# Patient Record
Sex: Male | Born: 1940 | Race: White | Hispanic: No | Marital: Single | Smoking: Former smoker
Health system: Southern US, Community
[De-identification: ages and names within clinical notes are randomized; demographics above are authoritative.]

## PROBLEM LIST (undated history)

## (undated) DIAGNOSIS — E039 Hypothyroidism, unspecified: Secondary | ICD-10-CM

## (undated) DIAGNOSIS — C61 Malignant neoplasm of prostate: Secondary | ICD-10-CM

## (undated) DIAGNOSIS — I10 Essential (primary) hypertension: Secondary | ICD-10-CM

## (undated) DIAGNOSIS — M1990 Unspecified osteoarthritis, unspecified site: Secondary | ICD-10-CM

## (undated) HISTORY — DX: Unspecified osteoarthritis, unspecified site: M19.90

## (undated) HISTORY — DX: Malignant neoplasm of prostate: C61

## (undated) HISTORY — DX: Hypothyroidism, unspecified: E03.9

---

## 2002-02-09 ENCOUNTER — Emergency Department (HOSPITAL_COMMUNITY): Admission: EM | Admit: 2002-02-09 | Discharge: 2002-02-09 | Payer: Self-pay | Admitting: Emergency Medicine

## 2005-08-01 ENCOUNTER — Ambulatory Visit: Payer: Self-pay | Admitting: Cardiology

## 2008-09-03 ENCOUNTER — Ambulatory Visit: Payer: Self-pay | Admitting: Cardiology

## 2008-09-09 ENCOUNTER — Ambulatory Visit: Payer: Self-pay | Admitting: Cardiology

## 2008-09-11 ENCOUNTER — Ambulatory Visit: Payer: Self-pay | Admitting: Cardiology

## 2008-10-10 ENCOUNTER — Ambulatory Visit: Payer: Self-pay | Admitting: Cardiology

## 2009-05-29 DIAGNOSIS — I10 Essential (primary) hypertension: Secondary | ICD-10-CM | POA: Insufficient documentation

## 2009-05-29 DIAGNOSIS — I498 Other specified cardiac arrhythmias: Secondary | ICD-10-CM

## 2011-01-25 NOTE — Assessment & Plan Note (Signed)
[facility]                          [facility] OFFICE NOTE   Steven Chapman, Steven Chapman                       MRN:          [id number]  DATE:10/10/2008                            DOB:          02-Sep-1941    PRIMARY CARDIOLOGIST:  Learta Codding, MD,FACC   REASON FOR VISIT:  Post-hospital followup.   Steven Chapman presents to our clinic for the first time, following recent  evaluation by our team here at [facility].  He was referred to [place]  with symptomatic bradycardia.  He presented with no known history of  coronary artery disease, but with several cardiac risk factors.   A 2-D echocardiogram was normal and, therefore, we recommended further  evaluation with outpatient exercise stress Cardiolite testing, as well  as CardioNet monitoring.  He subsequently declined to proceed with the  latter, however.  Stress testing was done for the purpose of both  excluding chronotropic incompetence, as well as any definite evidence of  ischemia.  Left ventricular function was normal both by  echocardiography, as well as stress testing.   The patient has not had any episodes of dizziness or near syncope.  We  felt that his symptoms were most likely related to the medication  tizanidine, which was noted to be associated with severe bradycardia.  He did present with significant hypertension, however, and  recommendation was to continue on lisinopril/HCTZ.   Review of stress testing indicates that his heart rate rose from 75 at  baseline to a maximum of 139.  His pulse today is 90.   Steven Chapman denies any history of exertional angina pectoris.   CURRENT MEDICATIONS:  Lisinopril and hydrochlorothiazide 20/12.5 mg 2  tablets q.a.m.   PHYSICAL EXAMINATION:  VITAL SIGNS:  Blood pressure 172/93, pulse 90 and  regular, and weight 193.  GENERAL:  A 70 year old male, sitting upright, no distress.  HEENT:  Normocephalic and atraumatic.  NECK:  Palpable bilateral  carotid pulses; no JVD.  LUNGS:  Clear to auscultation in all fields.  HEART:  Regular rate and rhythm.  No significant murmurs.  No rubs.  ABDOMEN:  Benign.  EXTREMITIES:  No edema.  NEUROLOGIC:  No focal deficit.   IMPRESSION:  1. Status post symptomatic sinus bradycardia.      a.     Most likely related to tizanidine.      b.     Normal left ventricular function.      c.     Negative, adequate exercise stress Cardiolite; EF 61%,       December 2009.      d.     No evidence of chronotropic incompetence.  2. Hypertension, uncontrolled.  3. COPD/ongoing tobacco.  4. History of alcohol abuse.   PLAN:  1. No further cardiac testing is indicated at this point in time.  The      patient has no evidence of recurrent bradycardia both by history or      by current vital signs.  Although we had initially recommended      further evaluation by CardioNet monitoring, which the patient  refused, his bradycardia was most likely related to the adverse      effects of tizanidine.  We do, however, recommended that the      patient start taking low-dose aspirin on a regular basis, for      primary prevention.  We also recommend that his lipid status be      reassessed.  Prior data indicated an LDL of 79 in 2006.  2. The patient needs more aggressive blood pressure control.  We spoke      at length regarding the importance of this.  He has agreed to have      an additional medication started by [place], Norvasc 10 mg daily, but      with subsequent monitoring and further recommendations to be      deferred to Dr. Olena Leatherwood, with whom he follows on a regular basis.      The patient also has been advised to monitor his blood pressure in      the ambulatory setting, so that he is more aware of what his blood      pressure readings are.  3. The patient will return to Dr. Andee Lineman on an as-needed basis.      Steven Serpe, PA-C  Electronically Signed      Learta Codding, MD,FACC  Electronically  Signed   GS/MedQ  DD: 10/10/2008  DT: 10/11/2008  Job #: [id number]   cc:   Lia Hopping

## 2011-09-30 DIAGNOSIS — F329 Major depressive disorder, single episode, unspecified: Secondary | ICD-10-CM | POA: Diagnosis not present

## 2011-09-30 DIAGNOSIS — I10 Essential (primary) hypertension: Secondary | ICD-10-CM | POA: Diagnosis not present

## 2011-12-30 DIAGNOSIS — I10 Essential (primary) hypertension: Secondary | ICD-10-CM | POA: Diagnosis not present

## 2012-04-05 DIAGNOSIS — M549 Dorsalgia, unspecified: Secondary | ICD-10-CM | POA: Diagnosis not present

## 2012-07-09 DIAGNOSIS — I10 Essential (primary) hypertension: Secondary | ICD-10-CM | POA: Diagnosis not present

## 2012-10-11 DIAGNOSIS — Z13220 Encounter for screening for lipoid disorders: Secondary | ICD-10-CM | POA: Diagnosis not present

## 2012-10-11 DIAGNOSIS — E782 Mixed hyperlipidemia: Secondary | ICD-10-CM | POA: Diagnosis not present

## 2012-10-11 DIAGNOSIS — Z125 Encounter for screening for malignant neoplasm of prostate: Secondary | ICD-10-CM | POA: Diagnosis not present

## 2012-10-11 DIAGNOSIS — I10 Essential (primary) hypertension: Secondary | ICD-10-CM | POA: Diagnosis not present

## 2012-10-11 DIAGNOSIS — M7040 Prepatellar bursitis, unspecified knee: Secondary | ICD-10-CM | POA: Diagnosis not present

## 2012-10-23 DIAGNOSIS — R39198 Other difficulties with micturition: Secondary | ICD-10-CM | POA: Diagnosis not present

## 2012-10-23 DIAGNOSIS — R972 Elevated prostate specific antigen [PSA]: Secondary | ICD-10-CM | POA: Diagnosis not present

## 2012-11-09 DIAGNOSIS — K5641 Fecal impaction: Secondary | ICD-10-CM | POA: Diagnosis not present

## 2012-11-09 DIAGNOSIS — Z79899 Other long term (current) drug therapy: Secondary | ICD-10-CM | POA: Diagnosis not present

## 2012-11-09 DIAGNOSIS — F17200 Nicotine dependence, unspecified, uncomplicated: Secondary | ICD-10-CM | POA: Diagnosis not present

## 2012-11-09 DIAGNOSIS — I10 Essential (primary) hypertension: Secondary | ICD-10-CM | POA: Diagnosis not present

## 2012-11-09 DIAGNOSIS — Z85038 Personal history of other malignant neoplasm of large intestine: Secondary | ICD-10-CM | POA: Diagnosis not present

## 2012-11-09 DIAGNOSIS — Z7982 Long term (current) use of aspirin: Secondary | ICD-10-CM | POA: Diagnosis not present

## 2012-11-21 DIAGNOSIS — R972 Elevated prostate specific antigen [PSA]: Secondary | ICD-10-CM | POA: Diagnosis not present

## 2013-01-10 DIAGNOSIS — I10 Essential (primary) hypertension: Secondary | ICD-10-CM | POA: Diagnosis not present

## 2013-01-17 DIAGNOSIS — H9390 Unspecified disorder of ear, unspecified ear: Secondary | ICD-10-CM | POA: Diagnosis not present

## 2013-01-17 DIAGNOSIS — Z79899 Other long term (current) drug therapy: Secondary | ICD-10-CM | POA: Diagnosis not present

## 2013-01-17 DIAGNOSIS — F17200 Nicotine dependence, unspecified, uncomplicated: Secondary | ICD-10-CM | POA: Diagnosis not present

## 2013-01-17 DIAGNOSIS — I10 Essential (primary) hypertension: Secondary | ICD-10-CM | POA: Diagnosis not present

## 2013-01-17 DIAGNOSIS — Z85038 Personal history of other malignant neoplasm of large intestine: Secondary | ICD-10-CM | POA: Diagnosis not present

## 2013-01-17 DIAGNOSIS — H9210 Otorrhea, unspecified ear: Secondary | ICD-10-CM | POA: Diagnosis not present

## 2013-02-08 DIAGNOSIS — R972 Elevated prostate specific antigen [PSA]: Secondary | ICD-10-CM | POA: Diagnosis not present

## 2013-02-20 DIAGNOSIS — R972 Elevated prostate specific antigen [PSA]: Secondary | ICD-10-CM | POA: Diagnosis not present

## 2013-04-12 DIAGNOSIS — I10 Essential (primary) hypertension: Secondary | ICD-10-CM | POA: Diagnosis not present

## 2013-07-15 DIAGNOSIS — I10 Essential (primary) hypertension: Secondary | ICD-10-CM | POA: Diagnosis not present

## 2013-08-22 DIAGNOSIS — R972 Elevated prostate specific antigen [PSA]: Secondary | ICD-10-CM | POA: Diagnosis not present

## 2013-08-28 DIAGNOSIS — R972 Elevated prostate specific antigen [PSA]: Secondary | ICD-10-CM | POA: Diagnosis not present

## 2013-08-28 DIAGNOSIS — R39198 Other difficulties with micturition: Secondary | ICD-10-CM | POA: Diagnosis not present

## 2013-10-17 DIAGNOSIS — Z0000 Encounter for general adult medical examination without abnormal findings: Secondary | ICD-10-CM | POA: Diagnosis not present

## 2013-10-17 DIAGNOSIS — I10 Essential (primary) hypertension: Secondary | ICD-10-CM | POA: Diagnosis not present

## 2013-12-07 DIAGNOSIS — Z79899 Other long term (current) drug therapy: Secondary | ICD-10-CM | POA: Diagnosis not present

## 2013-12-07 DIAGNOSIS — I10 Essential (primary) hypertension: Secondary | ICD-10-CM | POA: Diagnosis not present

## 2013-12-07 DIAGNOSIS — S0010XA Contusion of unspecified eyelid and periocular area, initial encounter: Secondary | ICD-10-CM | POA: Diagnosis not present

## 2013-12-07 DIAGNOSIS — H1130 Conjunctival hemorrhage, unspecified eye: Secondary | ICD-10-CM | POA: Diagnosis not present

## 2013-12-07 DIAGNOSIS — S0510XA Contusion of eyeball and orbital tissues, unspecified eye, initial encounter: Secondary | ICD-10-CM | POA: Diagnosis not present

## 2013-12-07 DIAGNOSIS — F17200 Nicotine dependence, unspecified, uncomplicated: Secondary | ICD-10-CM | POA: Diagnosis not present

## 2013-12-07 DIAGNOSIS — IMO0002 Reserved for concepts with insufficient information to code with codable children: Secondary | ICD-10-CM | POA: Diagnosis not present

## 2013-12-17 DIAGNOSIS — H1130 Conjunctival hemorrhage, unspecified eye: Secondary | ICD-10-CM | POA: Diagnosis not present

## 2014-01-20 DIAGNOSIS — Z125 Encounter for screening for malignant neoplasm of prostate: Secondary | ICD-10-CM | POA: Diagnosis not present

## 2014-01-20 DIAGNOSIS — Z0000 Encounter for general adult medical examination without abnormal findings: Secondary | ICD-10-CM | POA: Diagnosis not present

## 2014-01-20 DIAGNOSIS — I10 Essential (primary) hypertension: Secondary | ICD-10-CM | POA: Diagnosis not present

## 2014-01-20 DIAGNOSIS — E119 Type 2 diabetes mellitus without complications: Secondary | ICD-10-CM | POA: Diagnosis not present

## 2014-02-21 DIAGNOSIS — R972 Elevated prostate specific antigen [PSA]: Secondary | ICD-10-CM | POA: Diagnosis not present

## 2014-02-26 DIAGNOSIS — R972 Elevated prostate specific antigen [PSA]: Secondary | ICD-10-CM | POA: Diagnosis not present

## 2014-04-24 DIAGNOSIS — I10 Essential (primary) hypertension: Secondary | ICD-10-CM | POA: Diagnosis not present

## 2014-04-24 DIAGNOSIS — E119 Type 2 diabetes mellitus without complications: Secondary | ICD-10-CM | POA: Diagnosis not present

## 2014-07-07 DIAGNOSIS — Z23 Encounter for immunization: Secondary | ICD-10-CM | POA: Diagnosis not present

## 2014-07-25 DIAGNOSIS — I10 Essential (primary) hypertension: Secondary | ICD-10-CM | POA: Diagnosis not present

## 2014-07-25 DIAGNOSIS — Z131 Encounter for screening for diabetes mellitus: Secondary | ICD-10-CM | POA: Diagnosis not present

## 2014-07-25 DIAGNOSIS — M1990 Unspecified osteoarthritis, unspecified site: Secondary | ICD-10-CM | POA: Diagnosis not present

## 2014-07-25 DIAGNOSIS — E119 Type 2 diabetes mellitus without complications: Secondary | ICD-10-CM | POA: Diagnosis not present

## 2014-08-19 DIAGNOSIS — R972 Elevated prostate specific antigen [PSA]: Secondary | ICD-10-CM | POA: Diagnosis not present

## 2014-08-26 DIAGNOSIS — R972 Elevated prostate specific antigen [PSA]: Secondary | ICD-10-CM | POA: Diagnosis not present

## 2014-10-09 DIAGNOSIS — R972 Elevated prostate specific antigen [PSA]: Secondary | ICD-10-CM | POA: Diagnosis not present

## 2014-10-09 DIAGNOSIS — D291 Benign neoplasm of prostate: Secondary | ICD-10-CM | POA: Diagnosis not present

## 2014-10-09 DIAGNOSIS — C61 Malignant neoplasm of prostate: Secondary | ICD-10-CM | POA: Diagnosis not present

## 2014-10-24 DIAGNOSIS — R972 Elevated prostate specific antigen [PSA]: Secondary | ICD-10-CM | POA: Diagnosis not present

## 2014-10-24 DIAGNOSIS — C61 Malignant neoplasm of prostate: Secondary | ICD-10-CM | POA: Diagnosis not present

## 2014-10-30 DIAGNOSIS — I10 Essential (primary) hypertension: Secondary | ICD-10-CM | POA: Diagnosis not present

## 2014-10-30 DIAGNOSIS — D075 Carcinoma in situ of prostate: Secondary | ICD-10-CM | POA: Diagnosis not present

## 2014-11-14 DIAGNOSIS — C61 Malignant neoplasm of prostate: Secondary | ICD-10-CM | POA: Diagnosis not present

## 2014-12-23 DIAGNOSIS — N401 Enlarged prostate with lower urinary tract symptoms: Secondary | ICD-10-CM | POA: Diagnosis not present

## 2014-12-23 DIAGNOSIS — C61 Malignant neoplasm of prostate: Secondary | ICD-10-CM | POA: Diagnosis not present

## 2014-12-23 DIAGNOSIS — R3916 Straining to void: Secondary | ICD-10-CM | POA: Diagnosis not present

## 2015-01-29 DIAGNOSIS — Z1389 Encounter for screening for other disorder: Secondary | ICD-10-CM | POA: Diagnosis not present

## 2015-01-29 DIAGNOSIS — I10 Essential (primary) hypertension: Secondary | ICD-10-CM | POA: Diagnosis not present

## 2015-01-29 DIAGNOSIS — Z131 Encounter for screening for diabetes mellitus: Secondary | ICD-10-CM | POA: Diagnosis not present

## 2015-01-29 DIAGNOSIS — Z0000 Encounter for general adult medical examination without abnormal findings: Secondary | ICD-10-CM | POA: Diagnosis not present

## 2015-01-30 DIAGNOSIS — Z23 Encounter for immunization: Secondary | ICD-10-CM | POA: Diagnosis not present

## 2015-02-19 DIAGNOSIS — M85851 Other specified disorders of bone density and structure, right thigh: Secondary | ICD-10-CM | POA: Diagnosis not present

## 2015-02-19 DIAGNOSIS — M8588 Other specified disorders of bone density and structure, other site: Secondary | ICD-10-CM | POA: Diagnosis not present

## 2015-02-19 DIAGNOSIS — I10 Essential (primary) hypertension: Secondary | ICD-10-CM | POA: Diagnosis not present

## 2015-02-19 DIAGNOSIS — M85852 Other specified disorders of bone density and structure, left thigh: Secondary | ICD-10-CM | POA: Diagnosis not present

## 2015-02-19 DIAGNOSIS — F17200 Nicotine dependence, unspecified, uncomplicated: Secondary | ICD-10-CM | POA: Diagnosis not present

## 2015-02-19 DIAGNOSIS — M1990 Unspecified osteoarthritis, unspecified site: Secondary | ICD-10-CM | POA: Diagnosis not present

## 2015-02-19 DIAGNOSIS — Z79899 Other long term (current) drug therapy: Secondary | ICD-10-CM | POA: Diagnosis not present

## 2015-02-19 DIAGNOSIS — C189 Malignant neoplasm of colon, unspecified: Secondary | ICD-10-CM | POA: Diagnosis not present

## 2015-02-19 DIAGNOSIS — M8589 Other specified disorders of bone density and structure, multiple sites: Secondary | ICD-10-CM | POA: Diagnosis not present

## 2015-03-19 DIAGNOSIS — C61 Malignant neoplasm of prostate: Secondary | ICD-10-CM | POA: Diagnosis not present

## 2015-05-04 DIAGNOSIS — I10 Essential (primary) hypertension: Secondary | ICD-10-CM | POA: Diagnosis not present

## 2015-06-02 DIAGNOSIS — H2513 Age-related nuclear cataract, bilateral: Secondary | ICD-10-CM | POA: Diagnosis not present

## 2015-06-02 DIAGNOSIS — H538 Other visual disturbances: Secondary | ICD-10-CM | POA: Diagnosis not present

## 2015-06-16 DIAGNOSIS — Z23 Encounter for immunization: Secondary | ICD-10-CM | POA: Diagnosis not present

## 2015-07-02 DIAGNOSIS — H538 Other visual disturbances: Secondary | ICD-10-CM | POA: Diagnosis not present

## 2015-07-02 DIAGNOSIS — H2511 Age-related nuclear cataract, right eye: Secondary | ICD-10-CM | POA: Diagnosis not present

## 2015-07-06 DIAGNOSIS — H538 Other visual disturbances: Secondary | ICD-10-CM | POA: Diagnosis not present

## 2015-07-06 DIAGNOSIS — Z79899 Other long term (current) drug therapy: Secondary | ICD-10-CM | POA: Diagnosis not present

## 2015-07-06 DIAGNOSIS — H2511 Age-related nuclear cataract, right eye: Secondary | ICD-10-CM | POA: Diagnosis not present

## 2015-07-06 DIAGNOSIS — H1132 Conjunctival hemorrhage, left eye: Secondary | ICD-10-CM | POA: Diagnosis not present

## 2015-07-06 DIAGNOSIS — H5210 Myopia, unspecified eye: Secondary | ICD-10-CM | POA: Diagnosis not present

## 2015-07-06 DIAGNOSIS — H269 Unspecified cataract: Secondary | ICD-10-CM | POA: Diagnosis not present

## 2015-07-06 DIAGNOSIS — H01003 Unspecified blepharitis right eye, unspecified eyelid: Secondary | ICD-10-CM | POA: Diagnosis not present

## 2015-07-06 DIAGNOSIS — H2513 Age-related nuclear cataract, bilateral: Secondary | ICD-10-CM | POA: Diagnosis not present

## 2015-07-06 DIAGNOSIS — H01006 Unspecified blepharitis left eye, unspecified eyelid: Secondary | ICD-10-CM | POA: Diagnosis not present

## 2015-07-06 DIAGNOSIS — H524 Presbyopia: Secondary | ICD-10-CM | POA: Diagnosis not present

## 2015-07-06 DIAGNOSIS — I10 Essential (primary) hypertension: Secondary | ICD-10-CM | POA: Diagnosis not present

## 2015-07-06 DIAGNOSIS — H52209 Unspecified astigmatism, unspecified eye: Secondary | ICD-10-CM | POA: Diagnosis not present

## 2015-07-06 DIAGNOSIS — Z85038 Personal history of other malignant neoplasm of large intestine: Secondary | ICD-10-CM | POA: Diagnosis not present

## 2015-07-06 DIAGNOSIS — M1990 Unspecified osteoarthritis, unspecified site: Secondary | ICD-10-CM | POA: Diagnosis not present

## 2015-07-06 DIAGNOSIS — H5200 Hypermetropia, unspecified eye: Secondary | ICD-10-CM | POA: Diagnosis not present

## 2015-08-03 DIAGNOSIS — I10 Essential (primary) hypertension: Secondary | ICD-10-CM | POA: Diagnosis not present

## 2015-08-03 DIAGNOSIS — M170 Bilateral primary osteoarthritis of knee: Secondary | ICD-10-CM | POA: Diagnosis not present

## 2015-08-03 DIAGNOSIS — Z131 Encounter for screening for diabetes mellitus: Secondary | ICD-10-CM | POA: Diagnosis not present

## 2015-09-08 DIAGNOSIS — C61 Malignant neoplasm of prostate: Secondary | ICD-10-CM | POA: Diagnosis not present

## 2015-09-15 DIAGNOSIS — R972 Elevated prostate specific antigen [PSA]: Secondary | ICD-10-CM | POA: Diagnosis not present

## 2015-09-15 DIAGNOSIS — C61 Malignant neoplasm of prostate: Secondary | ICD-10-CM | POA: Diagnosis not present

## 2015-11-03 DIAGNOSIS — M170 Bilateral primary osteoarthritis of knee: Secondary | ICD-10-CM | POA: Diagnosis not present

## 2015-11-03 DIAGNOSIS — I10 Essential (primary) hypertension: Secondary | ICD-10-CM | POA: Diagnosis not present

## 2016-01-12 DIAGNOSIS — H538 Other visual disturbances: Secondary | ICD-10-CM | POA: Diagnosis not present

## 2016-01-12 DIAGNOSIS — H25012 Cortical age-related cataract, left eye: Secondary | ICD-10-CM | POA: Diagnosis not present

## 2016-02-02 DIAGNOSIS — I10 Essential (primary) hypertension: Secondary | ICD-10-CM | POA: Diagnosis not present

## 2016-02-02 DIAGNOSIS — Z131 Encounter for screening for diabetes mellitus: Secondary | ICD-10-CM | POA: Diagnosis not present

## 2016-02-02 DIAGNOSIS — M174 Other bilateral secondary osteoarthritis of knee: Secondary | ICD-10-CM | POA: Diagnosis not present

## 2016-02-02 DIAGNOSIS — Z1389 Encounter for screening for other disorder: Secondary | ICD-10-CM | POA: Diagnosis not present

## 2016-02-02 DIAGNOSIS — Z0000 Encounter for general adult medical examination without abnormal findings: Secondary | ICD-10-CM | POA: Diagnosis not present

## 2016-02-02 DIAGNOSIS — Z23 Encounter for immunization: Secondary | ICD-10-CM | POA: Diagnosis not present

## 2016-02-15 DIAGNOSIS — Z87891 Personal history of nicotine dependence: Secondary | ICD-10-CM | POA: Diagnosis not present

## 2016-02-15 DIAGNOSIS — Z136 Encounter for screening for cardiovascular disorders: Secondary | ICD-10-CM | POA: Diagnosis not present

## 2016-02-15 DIAGNOSIS — R1084 Generalized abdominal pain: Secondary | ICD-10-CM | POA: Diagnosis not present

## 2016-03-14 DIAGNOSIS — C61 Malignant neoplasm of prostate: Secondary | ICD-10-CM | POA: Diagnosis not present

## 2016-03-14 DIAGNOSIS — R972 Elevated prostate specific antigen [PSA]: Secondary | ICD-10-CM | POA: Diagnosis not present

## 2016-03-22 DIAGNOSIS — C61 Malignant neoplasm of prostate: Secondary | ICD-10-CM | POA: Diagnosis not present

## 2016-03-22 DIAGNOSIS — R972 Elevated prostate specific antigen [PSA]: Secondary | ICD-10-CM | POA: Diagnosis not present

## 2016-04-07 DIAGNOSIS — Z9049 Acquired absence of other specified parts of digestive tract: Secondary | ICD-10-CM | POA: Diagnosis not present

## 2016-04-07 DIAGNOSIS — Z85038 Personal history of other malignant neoplasm of large intestine: Secondary | ICD-10-CM | POA: Diagnosis not present

## 2016-04-07 DIAGNOSIS — Z79899 Other long term (current) drug therapy: Secondary | ICD-10-CM | POA: Diagnosis not present

## 2016-04-07 DIAGNOSIS — I10 Essential (primary) hypertension: Secondary | ICD-10-CM | POA: Diagnosis not present

## 2016-04-07 DIAGNOSIS — Z1211 Encounter for screening for malignant neoplasm of colon: Secondary | ICD-10-CM | POA: Diagnosis not present

## 2016-05-10 DIAGNOSIS — I10 Essential (primary) hypertension: Secondary | ICD-10-CM | POA: Diagnosis not present

## 2016-05-10 DIAGNOSIS — M174 Other bilateral secondary osteoarthritis of knee: Secondary | ICD-10-CM | POA: Diagnosis not present

## 2016-07-05 DIAGNOSIS — Z23 Encounter for immunization: Secondary | ICD-10-CM | POA: Diagnosis not present

## 2016-08-12 DIAGNOSIS — M174 Other bilateral secondary osteoarthritis of knee: Secondary | ICD-10-CM | POA: Diagnosis not present

## 2016-08-12 DIAGNOSIS — I10 Essential (primary) hypertension: Secondary | ICD-10-CM | POA: Diagnosis not present

## 2016-08-12 DIAGNOSIS — Z131 Encounter for screening for diabetes mellitus: Secondary | ICD-10-CM | POA: Diagnosis not present

## 2016-09-15 DIAGNOSIS — C61 Malignant neoplasm of prostate: Secondary | ICD-10-CM | POA: Diagnosis not present

## 2016-09-22 DIAGNOSIS — C61 Malignant neoplasm of prostate: Secondary | ICD-10-CM | POA: Diagnosis not present

## 2016-09-22 DIAGNOSIS — R972 Elevated prostate specific antigen [PSA]: Secondary | ICD-10-CM | POA: Diagnosis not present

## 2016-09-23 DIAGNOSIS — H26491 Other secondary cataract, right eye: Secondary | ICD-10-CM | POA: Diagnosis not present

## 2016-09-23 DIAGNOSIS — H2512 Age-related nuclear cataract, left eye: Secondary | ICD-10-CM | POA: Diagnosis not present

## 2016-09-23 DIAGNOSIS — H11153 Pinguecula, bilateral: Secondary | ICD-10-CM | POA: Diagnosis not present

## 2016-09-23 DIAGNOSIS — H35411 Lattice degeneration of retina, right eye: Secondary | ICD-10-CM | POA: Diagnosis not present

## 2016-09-23 DIAGNOSIS — H11823 Conjunctivochalasis, bilateral: Secondary | ICD-10-CM | POA: Diagnosis not present

## 2016-09-23 DIAGNOSIS — Z961 Presence of intraocular lens: Secondary | ICD-10-CM | POA: Diagnosis not present

## 2016-09-23 DIAGNOSIS — H25012 Cortical age-related cataract, left eye: Secondary | ICD-10-CM | POA: Diagnosis not present

## 2016-09-23 DIAGNOSIS — H25812 Combined forms of age-related cataract, left eye: Secondary | ICD-10-CM | POA: Diagnosis not present

## 2016-09-23 DIAGNOSIS — I10 Essential (primary) hypertension: Secondary | ICD-10-CM | POA: Diagnosis not present

## 2016-09-23 DIAGNOSIS — D3131 Benign neoplasm of right choroid: Secondary | ICD-10-CM | POA: Diagnosis not present

## 2016-09-23 DIAGNOSIS — H3589 Other specified retinal disorders: Secondary | ICD-10-CM | POA: Diagnosis not present

## 2016-09-23 DIAGNOSIS — H35033 Hypertensive retinopathy, bilateral: Secondary | ICD-10-CM | POA: Diagnosis not present

## 2016-11-14 DIAGNOSIS — Z125 Encounter for screening for malignant neoplasm of prostate: Secondary | ICD-10-CM | POA: Diagnosis not present

## 2016-11-14 DIAGNOSIS — Z131 Encounter for screening for diabetes mellitus: Secondary | ICD-10-CM | POA: Diagnosis not present

## 2016-11-14 DIAGNOSIS — M174 Other bilateral secondary osteoarthritis of knee: Secondary | ICD-10-CM | POA: Diagnosis not present

## 2016-11-14 DIAGNOSIS — I10 Essential (primary) hypertension: Secondary | ICD-10-CM | POA: Diagnosis not present

## 2016-11-23 ENCOUNTER — Ambulatory Visit (INDEPENDENT_AMBULATORY_CARE_PROVIDER_SITE_OTHER): Payer: Medicare Other | Admitting: Urology

## 2016-11-23 ENCOUNTER — Other Ambulatory Visit (HOSPITAL_COMMUNITY)
Admission: AD | Admit: 2016-11-23 | Discharge: 2016-11-23 | Disposition: A | Discharge status: Home / Self Care | Payer: Medicare Other | Source: Other Acute Inpatient Hospital | Attending: Urology | Admitting: Urology

## 2016-11-23 DIAGNOSIS — R3129 Other microscopic hematuria: Secondary | ICD-10-CM | POA: Insufficient documentation

## 2016-11-23 DIAGNOSIS — C61 Malignant neoplasm of prostate: Secondary | ICD-10-CM | POA: Diagnosis not present

## 2016-11-23 LAB — URINALYSIS, COMPLETE (UACMP) WITH MICROSCOPIC
BACTERIA UA: NONE SEEN
Bilirubin Urine: NEGATIVE
GLUCOSE, UA: NEGATIVE mg/dL
Hgb urine dipstick: NEGATIVE
Ketones, ur: 5 mg/dL — AB
Leukocytes, UA: NEGATIVE
Nitrite: NEGATIVE
PH: 7 (ref 5.0–8.0)
Protein, ur: NEGATIVE mg/dL
Specific Gravity, Urine: 1.008 (ref 1.005–1.030)
WBC, UA: NONE SEEN WBC/hpf (ref 0–5)

## 2016-11-24 ENCOUNTER — Other Ambulatory Visit: Payer: Self-pay | Admitting: Urology

## 2016-11-24 DIAGNOSIS — C61 Malignant neoplasm of prostate: Secondary | ICD-10-CM

## 2016-12-07 ENCOUNTER — Encounter (HOSPITAL_COMMUNITY): Payer: Self-pay

## 2016-12-07 ENCOUNTER — Ambulatory Visit (HOSPITAL_COMMUNITY)
Admission: RE | Admit: 2016-12-07 | Discharge: 2016-12-07 | Disposition: A | Discharge status: Home / Self Care | Payer: Medicare Other | Source: Ambulatory Visit | Attending: Urology | Admitting: Urology

## 2016-12-07 DIAGNOSIS — C61 Malignant neoplasm of prostate: Secondary | ICD-10-CM | POA: Diagnosis not present

## 2016-12-07 HISTORY — DX: Essential (primary) hypertension: I10

## 2016-12-07 MED ORDER — LIDOCAINE HCL (PF) 2 % IJ SOLN
INTRAMUSCULAR | Status: AC
  Administered 2016-12-07: 10 mL
  Filled 2016-12-07: qty 10

## 2016-12-07 MED ORDER — GENTAMICIN SULFATE 40 MG/ML IJ SOLN: 160.0000 mg | Freq: Once | INTRAMUSCULAR | Status: DC

## 2016-12-07 MED ORDER — GENTAMICIN SULFATE 40 MG/ML IJ SOLN
INTRAMUSCULAR | Status: AC
  Filled 2016-12-07: qty 2

## 2016-12-07 MED ORDER — GENTAMICIN SULFATE 40 MG/ML IJ SOLN
80.0000 mg | Freq: Once | INTRAMUSCULAR | Status: AC
Start: 2016-12-07 — End: 2016-12-07
  Administered 2016-12-07: 80 mg via INTRAMUSCULAR

## 2016-12-07 NOTE — Discharge Instructions (Signed)
Transrectal Ultrasound-Guided Biopsy °A transrectal ultrasound-guided biopsy is a procedure to take samples of tissue from your prostate. Ultrasound images are used to guide the procedure. It is usually done to check the prostate gland for cancer. °What happens before the procedure? °· Do not eat or drink after midnight on the night before your procedure. °· Take medicines as your doctor tells you. °· Your doctor may have you stop taking some medicines 5-7 days before the procedure. °· You will be given an enema before your procedure. During an enema, a liquid is put into your butt (rectum) to clear out waste. °· You may have lab tests the day of your procedure. °· Make plans to have someone drive you home. °What happens during the procedure? °· You will be given medicine to help you relax before the procedure. An IV tube will be put into one of your veins. It will be used to give fluids and medicine. °· You will be given medicine to reduce the risk of infection (antibiotic). °· You will be placed on your side. °· A probe with gel will be put in your butt. This is used to take pictures of your prostate and the area around it. °· A medicine to numb the area is put into your prostate. °· A biopsy needle is then inserted and guided to your prostate. °· Samples of prostate tissue are taken. The needle is removed. °· The samples are sent to a lab to be checked. Results are usually back in 2-3 days. °What happens after the procedure? °· You will be taken to a room where you will be watched until you are doing okay. °· You may have some pain in the area around your butt. You will be given medicines for this. °· You may be able to go home the same day. Sometimes, an overnight stay in the hospital is needed. °This information is not intended to replace advice given to you by your health care provider. Make sure you discuss any questions you have with your health care provider. °Document Released: 08/17/2009 Document Revised:  02/04/2016 Document Reviewed: 04/17/2013 °Elsevier Interactive Patient Education © 2017 Elsevier Inc. ° °

## 2016-12-21 ENCOUNTER — Ambulatory Visit (INDEPENDENT_AMBULATORY_CARE_PROVIDER_SITE_OTHER): Payer: Medicare Other | Admitting: Urology

## 2016-12-21 DIAGNOSIS — C61 Malignant neoplasm of prostate: Secondary | ICD-10-CM

## 2017-02-17 DIAGNOSIS — I10 Essential (primary) hypertension: Secondary | ICD-10-CM | POA: Diagnosis not present

## 2017-02-17 DIAGNOSIS — M174 Other bilateral secondary osteoarthritis of knee: Secondary | ICD-10-CM | POA: Diagnosis not present

## 2017-02-17 DIAGNOSIS — R601 Generalized edema: Secondary | ICD-10-CM | POA: Diagnosis not present

## 2017-06-06 DIAGNOSIS — Z131 Encounter for screening for diabetes mellitus: Secondary | ICD-10-CM | POA: Diagnosis not present

## 2017-06-06 DIAGNOSIS — Z0000 Encounter for general adult medical examination without abnormal findings: Secondary | ICD-10-CM | POA: Diagnosis not present

## 2017-06-06 DIAGNOSIS — Z1389 Encounter for screening for other disorder: Secondary | ICD-10-CM | POA: Diagnosis not present

## 2017-06-06 DIAGNOSIS — R601 Generalized edema: Secondary | ICD-10-CM | POA: Diagnosis not present

## 2017-06-06 DIAGNOSIS — M174 Other bilateral secondary osteoarthritis of knee: Secondary | ICD-10-CM | POA: Diagnosis not present

## 2017-06-06 DIAGNOSIS — I10 Essential (primary) hypertension: Secondary | ICD-10-CM | POA: Diagnosis not present

## 2017-06-06 DIAGNOSIS — Z79899 Other long term (current) drug therapy: Secondary | ICD-10-CM | POA: Diagnosis not present

## 2017-06-08 DIAGNOSIS — H11153 Pinguecula, bilateral: Secondary | ICD-10-CM | POA: Diagnosis not present

## 2017-06-08 DIAGNOSIS — Z23 Encounter for immunization: Secondary | ICD-10-CM | POA: Diagnosis not present

## 2017-06-08 DIAGNOSIS — H3540 Unspecified peripheral retinal degeneration: Secondary | ICD-10-CM | POA: Diagnosis not present

## 2017-06-08 DIAGNOSIS — D3131 Benign neoplasm of right choroid: Secondary | ICD-10-CM | POA: Diagnosis not present

## 2017-06-08 DIAGNOSIS — H26491 Other secondary cataract, right eye: Secondary | ICD-10-CM | POA: Diagnosis not present

## 2017-06-08 DIAGNOSIS — H25012 Cortical age-related cataract, left eye: Secondary | ICD-10-CM | POA: Diagnosis not present

## 2017-06-08 DIAGNOSIS — H25812 Combined forms of age-related cataract, left eye: Secondary | ICD-10-CM | POA: Diagnosis not present

## 2017-06-08 DIAGNOSIS — H2512 Age-related nuclear cataract, left eye: Secondary | ICD-10-CM | POA: Diagnosis not present

## 2017-06-08 DIAGNOSIS — H35443 Age-related reticular degeneration of retina, bilateral: Secondary | ICD-10-CM | POA: Diagnosis not present

## 2017-06-08 DIAGNOSIS — Z961 Presence of intraocular lens: Secondary | ICD-10-CM | POA: Diagnosis not present

## 2017-06-08 DIAGNOSIS — H11823 Conjunctivochalasis, bilateral: Secondary | ICD-10-CM | POA: Diagnosis not present

## 2017-06-08 DIAGNOSIS — H35463 Secondary vitreoretinal degeneration, bilateral: Secondary | ICD-10-CM | POA: Diagnosis not present

## 2017-06-08 DIAGNOSIS — D3132 Benign neoplasm of left choroid: Secondary | ICD-10-CM | POA: Diagnosis not present

## 2017-06-26 DIAGNOSIS — Z125 Encounter for screening for malignant neoplasm of prostate: Secondary | ICD-10-CM | POA: Diagnosis not present

## 2017-06-26 DIAGNOSIS — Z79899 Other long term (current) drug therapy: Secondary | ICD-10-CM | POA: Diagnosis not present

## 2017-06-26 DIAGNOSIS — Z0000 Encounter for general adult medical examination without abnormal findings: Secondary | ICD-10-CM | POA: Diagnosis not present

## 2017-07-05 ENCOUNTER — Ambulatory Visit: Payer: Medicare Other | Admitting: Urology

## 2017-08-30 ENCOUNTER — Ambulatory Visit (INDEPENDENT_AMBULATORY_CARE_PROVIDER_SITE_OTHER): Payer: Medicare Other | Admitting: Urology

## 2017-08-30 DIAGNOSIS — C61 Malignant neoplasm of prostate: Secondary | ICD-10-CM | POA: Diagnosis not present

## 2017-09-07 DIAGNOSIS — I10 Essential (primary) hypertension: Secondary | ICD-10-CM | POA: Diagnosis not present

## 2017-09-07 DIAGNOSIS — M174 Other bilateral secondary osteoarthritis of knee: Secondary | ICD-10-CM | POA: Diagnosis not present

## 2017-09-12 HISTORY — PX: STOMACH SURGERY: SHX791

## 2017-12-11 DIAGNOSIS — I10 Essential (primary) hypertension: Secondary | ICD-10-CM | POA: Diagnosis not present

## 2017-12-11 DIAGNOSIS — Z125 Encounter for screening for malignant neoplasm of prostate: Secondary | ICD-10-CM | POA: Diagnosis not present

## 2017-12-11 DIAGNOSIS — M174 Other bilateral secondary osteoarthritis of knee: Secondary | ICD-10-CM | POA: Diagnosis not present

## 2017-12-21 DIAGNOSIS — H35463 Secondary vitreoretinal degeneration, bilateral: Secondary | ICD-10-CM | POA: Diagnosis not present

## 2018-02-28 ENCOUNTER — Ambulatory Visit (INDEPENDENT_AMBULATORY_CARE_PROVIDER_SITE_OTHER): Payer: Medicare Other | Admitting: Urology

## 2018-02-28 DIAGNOSIS — C61 Malignant neoplasm of prostate: Secondary | ICD-10-CM

## 2018-03-20 DIAGNOSIS — I10 Essential (primary) hypertension: Secondary | ICD-10-CM | POA: Diagnosis not present

## 2018-03-20 DIAGNOSIS — M174 Other bilateral secondary osteoarthritis of knee: Secondary | ICD-10-CM | POA: Diagnosis not present

## 2018-05-24 DIAGNOSIS — Z23 Encounter for immunization: Secondary | ICD-10-CM | POA: Diagnosis not present

## 2018-06-22 DIAGNOSIS — H35443 Age-related reticular degeneration of retina, bilateral: Secondary | ICD-10-CM | POA: Diagnosis not present

## 2018-06-22 DIAGNOSIS — D3132 Benign neoplasm of left choroid: Secondary | ICD-10-CM | POA: Diagnosis not present

## 2018-06-22 DIAGNOSIS — H11153 Pinguecula, bilateral: Secondary | ICD-10-CM | POA: Diagnosis not present

## 2018-06-22 DIAGNOSIS — H25012 Cortical age-related cataract, left eye: Secondary | ICD-10-CM | POA: Diagnosis not present

## 2018-06-22 DIAGNOSIS — D3131 Benign neoplasm of right choroid: Secondary | ICD-10-CM | POA: Diagnosis not present

## 2018-06-22 DIAGNOSIS — H25812 Combined forms of age-related cataract, left eye: Secondary | ICD-10-CM | POA: Diagnosis not present

## 2018-06-22 DIAGNOSIS — H26491 Other secondary cataract, right eye: Secondary | ICD-10-CM | POA: Diagnosis not present

## 2018-06-22 DIAGNOSIS — Z961 Presence of intraocular lens: Secondary | ICD-10-CM | POA: Diagnosis not present

## 2018-06-22 DIAGNOSIS — H11823 Conjunctivochalasis, bilateral: Secondary | ICD-10-CM | POA: Diagnosis not present

## 2018-06-22 DIAGNOSIS — H2512 Age-related nuclear cataract, left eye: Secondary | ICD-10-CM | POA: Diagnosis not present

## 2018-06-22 DIAGNOSIS — H35463 Secondary vitreoretinal degeneration, bilateral: Secondary | ICD-10-CM | POA: Diagnosis not present

## 2018-06-22 DIAGNOSIS — H3540 Unspecified peripheral retinal degeneration: Secondary | ICD-10-CM | POA: Diagnosis not present

## 2018-06-27 DIAGNOSIS — Z0000 Encounter for general adult medical examination without abnormal findings: Secondary | ICD-10-CM | POA: Diagnosis not present

## 2018-06-27 DIAGNOSIS — I10 Essential (primary) hypertension: Secondary | ICD-10-CM | POA: Diagnosis not present

## 2018-06-27 DIAGNOSIS — M174 Other bilateral secondary osteoarthritis of knee: Secondary | ICD-10-CM | POA: Diagnosis not present

## 2018-06-27 DIAGNOSIS — Z1389 Encounter for screening for other disorder: Secondary | ICD-10-CM | POA: Diagnosis not present

## 2018-07-08 DIAGNOSIS — M1990 Unspecified osteoarthritis, unspecified site: Secondary | ICD-10-CM | POA: Diagnosis present

## 2018-07-08 DIAGNOSIS — F17220 Nicotine dependence, chewing tobacco, uncomplicated: Secondary | ICD-10-CM | POA: Diagnosis present

## 2018-07-08 DIAGNOSIS — K5651 Intestinal adhesions [bands], with partial obstruction: Secondary | ICD-10-CM | POA: Diagnosis not present

## 2018-07-08 DIAGNOSIS — T8149XA Infection following a procedure, other surgical site, initial encounter: Secondary | ICD-10-CM | POA: Diagnosis not present

## 2018-07-08 DIAGNOSIS — Z85038 Personal history of other malignant neoplasm of large intestine: Secondary | ICD-10-CM | POA: Diagnosis not present

## 2018-07-08 DIAGNOSIS — H409 Unspecified glaucoma: Secondary | ICD-10-CM | POA: Diagnosis present

## 2018-07-08 DIAGNOSIS — Z452 Encounter for adjustment and management of vascular access device: Secondary | ICD-10-CM | POA: Diagnosis not present

## 2018-07-08 DIAGNOSIS — B9689 Other specified bacterial agents as the cause of diseases classified elsewhere: Secondary | ICD-10-CM | POA: Diagnosis not present

## 2018-07-08 DIAGNOSIS — K529 Noninfective gastroenteritis and colitis, unspecified: Secondary | ICD-10-CM | POA: Diagnosis not present

## 2018-07-08 DIAGNOSIS — R109 Unspecified abdominal pain: Secondary | ICD-10-CM | POA: Diagnosis not present

## 2018-07-08 DIAGNOSIS — I10 Essential (primary) hypertension: Secondary | ICD-10-CM | POA: Diagnosis not present

## 2018-07-08 DIAGNOSIS — K56609 Unspecified intestinal obstruction, unspecified as to partial versus complete obstruction: Secondary | ICD-10-CM | POA: Diagnosis not present

## 2018-07-08 DIAGNOSIS — N400 Enlarged prostate without lower urinary tract symptoms: Secondary | ICD-10-CM | POA: Diagnosis present

## 2018-07-08 DIAGNOSIS — F411 Generalized anxiety disorder: Secondary | ICD-10-CM | POA: Diagnosis present

## 2018-07-08 DIAGNOSIS — I700 Atherosclerosis of aorta: Secondary | ICD-10-CM | POA: Diagnosis present

## 2018-07-08 DIAGNOSIS — C61 Malignant neoplasm of prostate: Secondary | ICD-10-CM | POA: Diagnosis present

## 2018-07-08 DIAGNOSIS — K567 Ileus, unspecified: Secondary | ICD-10-CM | POA: Diagnosis not present

## 2018-07-08 DIAGNOSIS — K56600 Partial intestinal obstruction, unspecified as to cause: Secondary | ICD-10-CM | POA: Diagnosis not present

## 2018-07-08 DIAGNOSIS — K5652 Intestinal adhesions [bands] with complete obstruction: Secondary | ICD-10-CM | POA: Diagnosis present

## 2018-07-08 DIAGNOSIS — E876 Hypokalemia: Secondary | ICD-10-CM | POA: Diagnosis present

## 2018-07-08 DIAGNOSIS — Z980 Intestinal bypass and anastomosis status: Secondary | ICD-10-CM | POA: Diagnosis not present

## 2018-07-24 DIAGNOSIS — Z85038 Personal history of other malignant neoplasm of large intestine: Secondary | ICD-10-CM | POA: Diagnosis not present

## 2018-07-24 DIAGNOSIS — T8141XA Infection following a procedure, superficial incisional surgical site, initial encounter: Secondary | ICD-10-CM | POA: Diagnosis not present

## 2018-07-24 DIAGNOSIS — B952 Enterococcus as the cause of diseases classified elsewhere: Secondary | ICD-10-CM | POA: Diagnosis not present

## 2018-07-24 DIAGNOSIS — Z4801 Encounter for change or removal of surgical wound dressing: Secondary | ICD-10-CM | POA: Diagnosis not present

## 2018-07-24 DIAGNOSIS — I10 Essential (primary) hypertension: Secondary | ICD-10-CM | POA: Diagnosis not present

## 2018-07-24 DIAGNOSIS — M1990 Unspecified osteoarthritis, unspecified site: Secondary | ICD-10-CM | POA: Diagnosis not present

## 2018-07-24 DIAGNOSIS — F17220 Nicotine dependence, chewing tobacco, uncomplicated: Secondary | ICD-10-CM | POA: Diagnosis not present

## 2018-07-25 DIAGNOSIS — Z4801 Encounter for change or removal of surgical wound dressing: Secondary | ICD-10-CM | POA: Diagnosis not present

## 2018-07-25 DIAGNOSIS — T8141XA Infection following a procedure, superficial incisional surgical site, initial encounter: Secondary | ICD-10-CM | POA: Diagnosis not present

## 2018-07-25 DIAGNOSIS — I10 Essential (primary) hypertension: Secondary | ICD-10-CM | POA: Diagnosis not present

## 2018-07-25 DIAGNOSIS — M1990 Unspecified osteoarthritis, unspecified site: Secondary | ICD-10-CM | POA: Diagnosis not present

## 2018-07-25 DIAGNOSIS — F17220 Nicotine dependence, chewing tobacco, uncomplicated: Secondary | ICD-10-CM | POA: Diagnosis not present

## 2018-07-25 DIAGNOSIS — B952 Enterococcus as the cause of diseases classified elsewhere: Secondary | ICD-10-CM | POA: Diagnosis not present

## 2018-07-26 DIAGNOSIS — I10 Essential (primary) hypertension: Secondary | ICD-10-CM | POA: Diagnosis not present

## 2018-07-26 DIAGNOSIS — Z4801 Encounter for change or removal of surgical wound dressing: Secondary | ICD-10-CM | POA: Diagnosis not present

## 2018-07-26 DIAGNOSIS — T8141XA Infection following a procedure, superficial incisional surgical site, initial encounter: Secondary | ICD-10-CM | POA: Diagnosis not present

## 2018-07-26 DIAGNOSIS — F17220 Nicotine dependence, chewing tobacco, uncomplicated: Secondary | ICD-10-CM | POA: Diagnosis not present

## 2018-07-26 DIAGNOSIS — B952 Enterococcus as the cause of diseases classified elsewhere: Secondary | ICD-10-CM | POA: Diagnosis not present

## 2018-07-26 DIAGNOSIS — M1990 Unspecified osteoarthritis, unspecified site: Secondary | ICD-10-CM | POA: Diagnosis not present

## 2018-07-27 DIAGNOSIS — F17220 Nicotine dependence, chewing tobacco, uncomplicated: Secondary | ICD-10-CM | POA: Diagnosis not present

## 2018-07-27 DIAGNOSIS — I10 Essential (primary) hypertension: Secondary | ICD-10-CM | POA: Diagnosis not present

## 2018-07-27 DIAGNOSIS — B952 Enterococcus as the cause of diseases classified elsewhere: Secondary | ICD-10-CM | POA: Diagnosis not present

## 2018-07-27 DIAGNOSIS — Z4801 Encounter for change or removal of surgical wound dressing: Secondary | ICD-10-CM | POA: Diagnosis not present

## 2018-07-27 DIAGNOSIS — T8141XA Infection following a procedure, superficial incisional surgical site, initial encounter: Secondary | ICD-10-CM | POA: Diagnosis not present

## 2018-07-27 DIAGNOSIS — M1990 Unspecified osteoarthritis, unspecified site: Secondary | ICD-10-CM | POA: Diagnosis not present

## 2018-07-28 DIAGNOSIS — T8141XA Infection following a procedure, superficial incisional surgical site, initial encounter: Secondary | ICD-10-CM | POA: Diagnosis not present

## 2018-07-28 DIAGNOSIS — I10 Essential (primary) hypertension: Secondary | ICD-10-CM | POA: Diagnosis not present

## 2018-07-28 DIAGNOSIS — M1990 Unspecified osteoarthritis, unspecified site: Secondary | ICD-10-CM | POA: Diagnosis not present

## 2018-07-28 DIAGNOSIS — Z4801 Encounter for change or removal of surgical wound dressing: Secondary | ICD-10-CM | POA: Diagnosis not present

## 2018-07-28 DIAGNOSIS — B952 Enterococcus as the cause of diseases classified elsewhere: Secondary | ICD-10-CM | POA: Diagnosis not present

## 2018-07-28 DIAGNOSIS — F17220 Nicotine dependence, chewing tobacco, uncomplicated: Secondary | ICD-10-CM | POA: Diagnosis not present

## 2018-07-30 DIAGNOSIS — I10 Essential (primary) hypertension: Secondary | ICD-10-CM | POA: Diagnosis not present

## 2018-07-30 DIAGNOSIS — F17220 Nicotine dependence, chewing tobacco, uncomplicated: Secondary | ICD-10-CM | POA: Diagnosis not present

## 2018-07-30 DIAGNOSIS — M1990 Unspecified osteoarthritis, unspecified site: Secondary | ICD-10-CM | POA: Diagnosis not present

## 2018-07-30 DIAGNOSIS — Z4801 Encounter for change or removal of surgical wound dressing: Secondary | ICD-10-CM | POA: Diagnosis not present

## 2018-07-30 DIAGNOSIS — T8141XA Infection following a procedure, superficial incisional surgical site, initial encounter: Secondary | ICD-10-CM | POA: Diagnosis not present

## 2018-07-30 DIAGNOSIS — B952 Enterococcus as the cause of diseases classified elsewhere: Secondary | ICD-10-CM | POA: Diagnosis not present

## 2018-07-31 DIAGNOSIS — I10 Essential (primary) hypertension: Secondary | ICD-10-CM | POA: Diagnosis not present

## 2018-07-31 DIAGNOSIS — F418 Other specified anxiety disorders: Secondary | ICD-10-CM | POA: Diagnosis not present

## 2018-07-31 DIAGNOSIS — K5652 Intestinal adhesions [bands] with complete obstruction: Secondary | ICD-10-CM | POA: Diagnosis not present

## 2018-08-01 DIAGNOSIS — T8141XA Infection following a procedure, superficial incisional surgical site, initial encounter: Secondary | ICD-10-CM | POA: Diagnosis not present

## 2018-08-01 DIAGNOSIS — I10 Essential (primary) hypertension: Secondary | ICD-10-CM | POA: Diagnosis not present

## 2018-08-01 DIAGNOSIS — K565 Intestinal adhesions [bands], unspecified as to partial versus complete obstruction: Secondary | ICD-10-CM | POA: Diagnosis not present

## 2018-08-01 DIAGNOSIS — F17220 Nicotine dependence, chewing tobacco, uncomplicated: Secondary | ICD-10-CM | POA: Diagnosis not present

## 2018-08-01 DIAGNOSIS — B952 Enterococcus as the cause of diseases classified elsewhere: Secondary | ICD-10-CM | POA: Diagnosis not present

## 2018-08-01 DIAGNOSIS — M1990 Unspecified osteoarthritis, unspecified site: Secondary | ICD-10-CM | POA: Diagnosis not present

## 2018-08-01 DIAGNOSIS — Z4801 Encounter for change or removal of surgical wound dressing: Secondary | ICD-10-CM | POA: Diagnosis not present

## 2018-08-03 DIAGNOSIS — B952 Enterococcus as the cause of diseases classified elsewhere: Secondary | ICD-10-CM | POA: Diagnosis not present

## 2018-08-03 DIAGNOSIS — F17220 Nicotine dependence, chewing tobacco, uncomplicated: Secondary | ICD-10-CM | POA: Diagnosis not present

## 2018-08-03 DIAGNOSIS — T8141XA Infection following a procedure, superficial incisional surgical site, initial encounter: Secondary | ICD-10-CM | POA: Diagnosis not present

## 2018-08-03 DIAGNOSIS — I10 Essential (primary) hypertension: Secondary | ICD-10-CM | POA: Diagnosis not present

## 2018-08-03 DIAGNOSIS — Z4801 Encounter for change or removal of surgical wound dressing: Secondary | ICD-10-CM | POA: Diagnosis not present

## 2018-08-03 DIAGNOSIS — M1990 Unspecified osteoarthritis, unspecified site: Secondary | ICD-10-CM | POA: Diagnosis not present

## 2018-08-06 DIAGNOSIS — M1990 Unspecified osteoarthritis, unspecified site: Secondary | ICD-10-CM | POA: Diagnosis not present

## 2018-08-06 DIAGNOSIS — I10 Essential (primary) hypertension: Secondary | ICD-10-CM | POA: Diagnosis not present

## 2018-08-06 DIAGNOSIS — T8141XA Infection following a procedure, superficial incisional surgical site, initial encounter: Secondary | ICD-10-CM | POA: Diagnosis not present

## 2018-08-06 DIAGNOSIS — Z4801 Encounter for change or removal of surgical wound dressing: Secondary | ICD-10-CM | POA: Diagnosis not present

## 2018-08-06 DIAGNOSIS — F17220 Nicotine dependence, chewing tobacco, uncomplicated: Secondary | ICD-10-CM | POA: Diagnosis not present

## 2018-08-06 DIAGNOSIS — B952 Enterococcus as the cause of diseases classified elsewhere: Secondary | ICD-10-CM | POA: Diagnosis not present

## 2018-08-08 DIAGNOSIS — I10 Essential (primary) hypertension: Secondary | ICD-10-CM | POA: Diagnosis not present

## 2018-08-08 DIAGNOSIS — T8141XA Infection following a procedure, superficial incisional surgical site, initial encounter: Secondary | ICD-10-CM | POA: Diagnosis not present

## 2018-08-08 DIAGNOSIS — M1990 Unspecified osteoarthritis, unspecified site: Secondary | ICD-10-CM | POA: Diagnosis not present

## 2018-08-08 DIAGNOSIS — F17220 Nicotine dependence, chewing tobacco, uncomplicated: Secondary | ICD-10-CM | POA: Diagnosis not present

## 2018-08-08 DIAGNOSIS — B952 Enterococcus as the cause of diseases classified elsewhere: Secondary | ICD-10-CM | POA: Diagnosis not present

## 2018-08-08 DIAGNOSIS — Z4801 Encounter for change or removal of surgical wound dressing: Secondary | ICD-10-CM | POA: Diagnosis not present

## 2018-08-10 DIAGNOSIS — T8141XA Infection following a procedure, superficial incisional surgical site, initial encounter: Secondary | ICD-10-CM | POA: Diagnosis not present

## 2018-08-10 DIAGNOSIS — Z4801 Encounter for change or removal of surgical wound dressing: Secondary | ICD-10-CM | POA: Diagnosis not present

## 2018-08-10 DIAGNOSIS — I10 Essential (primary) hypertension: Secondary | ICD-10-CM | POA: Diagnosis not present

## 2018-08-10 DIAGNOSIS — M1990 Unspecified osteoarthritis, unspecified site: Secondary | ICD-10-CM | POA: Diagnosis not present

## 2018-08-10 DIAGNOSIS — B952 Enterococcus as the cause of diseases classified elsewhere: Secondary | ICD-10-CM | POA: Diagnosis not present

## 2018-08-10 DIAGNOSIS — F17220 Nicotine dependence, chewing tobacco, uncomplicated: Secondary | ICD-10-CM | POA: Diagnosis not present

## 2018-08-13 DIAGNOSIS — I10 Essential (primary) hypertension: Secondary | ICD-10-CM | POA: Diagnosis not present

## 2018-08-13 DIAGNOSIS — Z4801 Encounter for change or removal of surgical wound dressing: Secondary | ICD-10-CM | POA: Diagnosis not present

## 2018-08-13 DIAGNOSIS — M1990 Unspecified osteoarthritis, unspecified site: Secondary | ICD-10-CM | POA: Diagnosis not present

## 2018-08-13 DIAGNOSIS — T8141XA Infection following a procedure, superficial incisional surgical site, initial encounter: Secondary | ICD-10-CM | POA: Diagnosis not present

## 2018-08-13 DIAGNOSIS — F17220 Nicotine dependence, chewing tobacco, uncomplicated: Secondary | ICD-10-CM | POA: Diagnosis not present

## 2018-08-13 DIAGNOSIS — B952 Enterococcus as the cause of diseases classified elsewhere: Secondary | ICD-10-CM | POA: Diagnosis not present

## 2018-08-17 DIAGNOSIS — Z4801 Encounter for change or removal of surgical wound dressing: Secondary | ICD-10-CM | POA: Diagnosis not present

## 2018-08-17 DIAGNOSIS — T8141XA Infection following a procedure, superficial incisional surgical site, initial encounter: Secondary | ICD-10-CM | POA: Diagnosis not present

## 2018-08-17 DIAGNOSIS — B952 Enterococcus as the cause of diseases classified elsewhere: Secondary | ICD-10-CM | POA: Diagnosis not present

## 2018-08-17 DIAGNOSIS — F17220 Nicotine dependence, chewing tobacco, uncomplicated: Secondary | ICD-10-CM | POA: Diagnosis not present

## 2018-08-17 DIAGNOSIS — M1990 Unspecified osteoarthritis, unspecified site: Secondary | ICD-10-CM | POA: Diagnosis not present

## 2018-08-17 DIAGNOSIS — I10 Essential (primary) hypertension: Secondary | ICD-10-CM | POA: Diagnosis not present

## 2018-08-20 DIAGNOSIS — C61 Malignant neoplasm of prostate: Secondary | ICD-10-CM | POA: Diagnosis not present

## 2018-08-20 DIAGNOSIS — Z4801 Encounter for change or removal of surgical wound dressing: Secondary | ICD-10-CM | POA: Diagnosis not present

## 2018-08-20 DIAGNOSIS — I10 Essential (primary) hypertension: Secondary | ICD-10-CM | POA: Diagnosis not present

## 2018-08-20 DIAGNOSIS — B952 Enterococcus as the cause of diseases classified elsewhere: Secondary | ICD-10-CM | POA: Diagnosis not present

## 2018-08-20 DIAGNOSIS — M1990 Unspecified osteoarthritis, unspecified site: Secondary | ICD-10-CM | POA: Diagnosis not present

## 2018-08-20 DIAGNOSIS — F17220 Nicotine dependence, chewing tobacco, uncomplicated: Secondary | ICD-10-CM | POA: Diagnosis not present

## 2018-08-20 DIAGNOSIS — T8141XA Infection following a procedure, superficial incisional surgical site, initial encounter: Secondary | ICD-10-CM | POA: Diagnosis not present

## 2018-08-22 DIAGNOSIS — M1990 Unspecified osteoarthritis, unspecified site: Secondary | ICD-10-CM | POA: Diagnosis not present

## 2018-08-22 DIAGNOSIS — B952 Enterococcus as the cause of diseases classified elsewhere: Secondary | ICD-10-CM | POA: Diagnosis not present

## 2018-08-22 DIAGNOSIS — T8141XA Infection following a procedure, superficial incisional surgical site, initial encounter: Secondary | ICD-10-CM | POA: Diagnosis not present

## 2018-08-22 DIAGNOSIS — Z4801 Encounter for change or removal of surgical wound dressing: Secondary | ICD-10-CM | POA: Diagnosis not present

## 2018-08-22 DIAGNOSIS — I10 Essential (primary) hypertension: Secondary | ICD-10-CM | POA: Diagnosis not present

## 2018-08-22 DIAGNOSIS — F17220 Nicotine dependence, chewing tobacco, uncomplicated: Secondary | ICD-10-CM | POA: Diagnosis not present

## 2018-08-24 DIAGNOSIS — F17220 Nicotine dependence, chewing tobacco, uncomplicated: Secondary | ICD-10-CM | POA: Diagnosis not present

## 2018-08-24 DIAGNOSIS — I10 Essential (primary) hypertension: Secondary | ICD-10-CM | POA: Diagnosis not present

## 2018-08-24 DIAGNOSIS — B952 Enterococcus as the cause of diseases classified elsewhere: Secondary | ICD-10-CM | POA: Diagnosis not present

## 2018-08-24 DIAGNOSIS — T8141XA Infection following a procedure, superficial incisional surgical site, initial encounter: Secondary | ICD-10-CM | POA: Diagnosis not present

## 2018-08-24 DIAGNOSIS — M1990 Unspecified osteoarthritis, unspecified site: Secondary | ICD-10-CM | POA: Diagnosis not present

## 2018-08-24 DIAGNOSIS — Z4801 Encounter for change or removal of surgical wound dressing: Secondary | ICD-10-CM | POA: Diagnosis not present

## 2018-08-27 DIAGNOSIS — M1990 Unspecified osteoarthritis, unspecified site: Secondary | ICD-10-CM | POA: Diagnosis not present

## 2018-08-27 DIAGNOSIS — I10 Essential (primary) hypertension: Secondary | ICD-10-CM | POA: Diagnosis not present

## 2018-08-27 DIAGNOSIS — Z4801 Encounter for change or removal of surgical wound dressing: Secondary | ICD-10-CM | POA: Diagnosis not present

## 2018-08-27 DIAGNOSIS — F17220 Nicotine dependence, chewing tobacco, uncomplicated: Secondary | ICD-10-CM | POA: Diagnosis not present

## 2018-08-27 DIAGNOSIS — T8141XA Infection following a procedure, superficial incisional surgical site, initial encounter: Secondary | ICD-10-CM | POA: Diagnosis not present

## 2018-08-27 DIAGNOSIS — B952 Enterococcus as the cause of diseases classified elsewhere: Secondary | ICD-10-CM | POA: Diagnosis not present

## 2018-08-29 ENCOUNTER — Ambulatory Visit (INDEPENDENT_AMBULATORY_CARE_PROVIDER_SITE_OTHER): Payer: Medicare Other | Admitting: Urology

## 2018-08-29 DIAGNOSIS — C61 Malignant neoplasm of prostate: Secondary | ICD-10-CM | POA: Diagnosis not present

## 2018-08-31 DIAGNOSIS — T8189XA Other complications of procedures, not elsewhere classified, initial encounter: Secondary | ICD-10-CM | POA: Diagnosis not present

## 2018-08-31 DIAGNOSIS — S31109A Unspecified open wound of abdominal wall, unspecified quadrant without penetration into peritoneal cavity, initial encounter: Secondary | ICD-10-CM | POA: Diagnosis not present

## 2018-09-01 DIAGNOSIS — T8189XA Other complications of procedures, not elsewhere classified, initial encounter: Secondary | ICD-10-CM | POA: Diagnosis not present

## 2018-09-01 DIAGNOSIS — Z4801 Encounter for change or removal of surgical wound dressing: Secondary | ICD-10-CM | POA: Diagnosis not present

## 2018-09-03 DIAGNOSIS — I10 Essential (primary) hypertension: Secondary | ICD-10-CM | POA: Diagnosis not present

## 2018-09-03 DIAGNOSIS — Z4801 Encounter for change or removal of surgical wound dressing: Secondary | ICD-10-CM | POA: Diagnosis not present

## 2018-09-03 DIAGNOSIS — M1990 Unspecified osteoarthritis, unspecified site: Secondary | ICD-10-CM | POA: Diagnosis not present

## 2018-09-03 DIAGNOSIS — T8141XA Infection following a procedure, superficial incisional surgical site, initial encounter: Secondary | ICD-10-CM | POA: Diagnosis not present

## 2018-09-03 DIAGNOSIS — B952 Enterococcus as the cause of diseases classified elsewhere: Secondary | ICD-10-CM | POA: Diagnosis not present

## 2018-09-03 DIAGNOSIS — F17220 Nicotine dependence, chewing tobacco, uncomplicated: Secondary | ICD-10-CM | POA: Diagnosis not present

## 2018-09-06 DIAGNOSIS — Z4801 Encounter for change or removal of surgical wound dressing: Secondary | ICD-10-CM | POA: Diagnosis not present

## 2018-09-06 DIAGNOSIS — I10 Essential (primary) hypertension: Secondary | ICD-10-CM | POA: Diagnosis not present

## 2018-09-06 DIAGNOSIS — M1990 Unspecified osteoarthritis, unspecified site: Secondary | ICD-10-CM | POA: Diagnosis not present

## 2018-09-06 DIAGNOSIS — B952 Enterococcus as the cause of diseases classified elsewhere: Secondary | ICD-10-CM | POA: Diagnosis not present

## 2018-09-06 DIAGNOSIS — T8141XA Infection following a procedure, superficial incisional surgical site, initial encounter: Secondary | ICD-10-CM | POA: Diagnosis not present

## 2018-09-06 DIAGNOSIS — F17220 Nicotine dependence, chewing tobacco, uncomplicated: Secondary | ICD-10-CM | POA: Diagnosis not present

## 2018-09-08 DIAGNOSIS — M1990 Unspecified osteoarthritis, unspecified site: Secondary | ICD-10-CM | POA: Diagnosis not present

## 2018-09-08 DIAGNOSIS — T8141XA Infection following a procedure, superficial incisional surgical site, initial encounter: Secondary | ICD-10-CM | POA: Diagnosis not present

## 2018-09-08 DIAGNOSIS — Z4801 Encounter for change or removal of surgical wound dressing: Secondary | ICD-10-CM | POA: Diagnosis not present

## 2018-09-08 DIAGNOSIS — B952 Enterococcus as the cause of diseases classified elsewhere: Secondary | ICD-10-CM | POA: Diagnosis not present

## 2018-09-08 DIAGNOSIS — F17220 Nicotine dependence, chewing tobacco, uncomplicated: Secondary | ICD-10-CM | POA: Diagnosis not present

## 2018-09-08 DIAGNOSIS — I10 Essential (primary) hypertension: Secondary | ICD-10-CM | POA: Diagnosis not present

## 2018-09-10 DIAGNOSIS — T8141XA Infection following a procedure, superficial incisional surgical site, initial encounter: Secondary | ICD-10-CM | POA: Diagnosis not present

## 2018-09-10 DIAGNOSIS — M1990 Unspecified osteoarthritis, unspecified site: Secondary | ICD-10-CM | POA: Diagnosis not present

## 2018-09-10 DIAGNOSIS — I10 Essential (primary) hypertension: Secondary | ICD-10-CM | POA: Diagnosis not present

## 2018-09-10 DIAGNOSIS — Z4801 Encounter for change or removal of surgical wound dressing: Secondary | ICD-10-CM | POA: Diagnosis not present

## 2018-09-10 DIAGNOSIS — B952 Enterococcus as the cause of diseases classified elsewhere: Secondary | ICD-10-CM | POA: Diagnosis not present

## 2018-09-10 DIAGNOSIS — F17220 Nicotine dependence, chewing tobacco, uncomplicated: Secondary | ICD-10-CM | POA: Diagnosis not present

## 2018-09-13 DIAGNOSIS — M1990 Unspecified osteoarthritis, unspecified site: Secondary | ICD-10-CM | POA: Diagnosis not present

## 2018-09-13 DIAGNOSIS — Z4801 Encounter for change or removal of surgical wound dressing: Secondary | ICD-10-CM | POA: Diagnosis not present

## 2018-09-13 DIAGNOSIS — I10 Essential (primary) hypertension: Secondary | ICD-10-CM | POA: Diagnosis not present

## 2018-09-13 DIAGNOSIS — F17220 Nicotine dependence, chewing tobacco, uncomplicated: Secondary | ICD-10-CM | POA: Diagnosis not present

## 2018-09-13 DIAGNOSIS — T8141XA Infection following a procedure, superficial incisional surgical site, initial encounter: Secondary | ICD-10-CM | POA: Diagnosis not present

## 2018-09-13 DIAGNOSIS — B952 Enterococcus as the cause of diseases classified elsewhere: Secondary | ICD-10-CM | POA: Diagnosis not present

## 2018-09-15 DIAGNOSIS — M1990 Unspecified osteoarthritis, unspecified site: Secondary | ICD-10-CM | POA: Diagnosis not present

## 2018-09-15 DIAGNOSIS — T8141XA Infection following a procedure, superficial incisional surgical site, initial encounter: Secondary | ICD-10-CM | POA: Diagnosis not present

## 2018-09-15 DIAGNOSIS — Z4801 Encounter for change or removal of surgical wound dressing: Secondary | ICD-10-CM | POA: Diagnosis not present

## 2018-09-15 DIAGNOSIS — B952 Enterococcus as the cause of diseases classified elsewhere: Secondary | ICD-10-CM | POA: Diagnosis not present

## 2018-09-15 DIAGNOSIS — I10 Essential (primary) hypertension: Secondary | ICD-10-CM | POA: Diagnosis not present

## 2018-09-15 DIAGNOSIS — F17220 Nicotine dependence, chewing tobacco, uncomplicated: Secondary | ICD-10-CM | POA: Diagnosis not present

## 2018-09-17 DIAGNOSIS — M1990 Unspecified osteoarthritis, unspecified site: Secondary | ICD-10-CM | POA: Diagnosis not present

## 2018-09-17 DIAGNOSIS — B952 Enterococcus as the cause of diseases classified elsewhere: Secondary | ICD-10-CM | POA: Diagnosis not present

## 2018-09-17 DIAGNOSIS — T8141XA Infection following a procedure, superficial incisional surgical site, initial encounter: Secondary | ICD-10-CM | POA: Diagnosis not present

## 2018-09-17 DIAGNOSIS — F17220 Nicotine dependence, chewing tobacco, uncomplicated: Secondary | ICD-10-CM | POA: Diagnosis not present

## 2018-09-17 DIAGNOSIS — Z4801 Encounter for change or removal of surgical wound dressing: Secondary | ICD-10-CM | POA: Diagnosis not present

## 2018-09-17 DIAGNOSIS — I10 Essential (primary) hypertension: Secondary | ICD-10-CM | POA: Diagnosis not present

## 2018-09-20 DIAGNOSIS — F17220 Nicotine dependence, chewing tobacco, uncomplicated: Secondary | ICD-10-CM | POA: Diagnosis not present

## 2018-09-20 DIAGNOSIS — I10 Essential (primary) hypertension: Secondary | ICD-10-CM | POA: Diagnosis not present

## 2018-09-20 DIAGNOSIS — T8141XA Infection following a procedure, superficial incisional surgical site, initial encounter: Secondary | ICD-10-CM | POA: Diagnosis not present

## 2018-09-20 DIAGNOSIS — B952 Enterococcus as the cause of diseases classified elsewhere: Secondary | ICD-10-CM | POA: Diagnosis not present

## 2018-09-20 DIAGNOSIS — M1990 Unspecified osteoarthritis, unspecified site: Secondary | ICD-10-CM | POA: Diagnosis not present

## 2018-09-20 DIAGNOSIS — Z4801 Encounter for change or removal of surgical wound dressing: Secondary | ICD-10-CM | POA: Diagnosis not present

## 2018-10-02 DIAGNOSIS — I10 Essential (primary) hypertension: Secondary | ICD-10-CM | POA: Diagnosis not present

## 2018-10-02 DIAGNOSIS — F418 Other specified anxiety disorders: Secondary | ICD-10-CM | POA: Diagnosis not present

## 2018-10-02 DIAGNOSIS — M179 Osteoarthritis of knee, unspecified: Secondary | ICD-10-CM | POA: Diagnosis not present

## 2018-10-29 DIAGNOSIS — K565 Intestinal adhesions [bands], unspecified as to partial versus complete obstruction: Secondary | ICD-10-CM | POA: Diagnosis not present

## 2019-01-09 DIAGNOSIS — I10 Essential (primary) hypertension: Secondary | ICD-10-CM | POA: Diagnosis not present

## 2019-01-09 DIAGNOSIS — F419 Anxiety disorder, unspecified: Secondary | ICD-10-CM | POA: Diagnosis not present

## 2019-01-09 DIAGNOSIS — F418 Other specified anxiety disorders: Secondary | ICD-10-CM | POA: Diagnosis not present

## 2019-01-09 DIAGNOSIS — M179 Osteoarthritis of knee, unspecified: Secondary | ICD-10-CM | POA: Diagnosis not present

## 2019-02-18 DIAGNOSIS — C61 Malignant neoplasm of prostate: Secondary | ICD-10-CM | POA: Diagnosis not present

## 2019-02-27 ENCOUNTER — Ambulatory Visit (INDEPENDENT_AMBULATORY_CARE_PROVIDER_SITE_OTHER): Payer: Medicare Other | Admitting: Urology

## 2019-02-27 DIAGNOSIS — C61 Malignant neoplasm of prostate: Secondary | ICD-10-CM

## 2019-03-04 DIAGNOSIS — H35033 Hypertensive retinopathy, bilateral: Secondary | ICD-10-CM | POA: Diagnosis not present

## 2019-03-25 DIAGNOSIS — T8189XA Other complications of procedures, not elsewhere classified, initial encounter: Secondary | ICD-10-CM | POA: Diagnosis not present

## 2019-04-11 DIAGNOSIS — F419 Anxiety disorder, unspecified: Secondary | ICD-10-CM | POA: Diagnosis not present

## 2019-04-11 DIAGNOSIS — M179 Osteoarthritis of knee, unspecified: Secondary | ICD-10-CM | POA: Diagnosis not present

## 2019-04-11 DIAGNOSIS — I10 Essential (primary) hypertension: Secondary | ICD-10-CM | POA: Diagnosis not present

## 2019-05-27 DIAGNOSIS — T8189XA Other complications of procedures, not elsewhere classified, initial encounter: Secondary | ICD-10-CM | POA: Diagnosis not present

## 2019-07-10 DIAGNOSIS — T8189XA Other complications of procedures, not elsewhere classified, initial encounter: Secondary | ICD-10-CM | POA: Diagnosis not present

## 2019-07-16 DIAGNOSIS — F419 Anxiety disorder, unspecified: Secondary | ICD-10-CM | POA: Diagnosis not present

## 2019-07-16 DIAGNOSIS — M179 Osteoarthritis of knee, unspecified: Secondary | ICD-10-CM | POA: Diagnosis not present

## 2019-07-16 DIAGNOSIS — I10 Essential (primary) hypertension: Secondary | ICD-10-CM | POA: Diagnosis not present

## 2019-07-16 DIAGNOSIS — Z1389 Encounter for screening for other disorder: Secondary | ICD-10-CM | POA: Diagnosis not present

## 2019-07-16 DIAGNOSIS — Z0000 Encounter for general adult medical examination without abnormal findings: Secondary | ICD-10-CM | POA: Diagnosis not present

## 2019-07-17 DIAGNOSIS — Z23 Encounter for immunization: Secondary | ICD-10-CM | POA: Diagnosis not present

## 2019-08-07 ENCOUNTER — Other Ambulatory Visit: Payer: Self-pay

## 2019-08-19 DIAGNOSIS — C61 Malignant neoplasm of prostate: Secondary | ICD-10-CM | POA: Diagnosis not present

## 2019-10-02 ENCOUNTER — Ambulatory Visit (INDEPENDENT_AMBULATORY_CARE_PROVIDER_SITE_OTHER): Payer: Medicare Other | Admitting: Urology

## 2019-10-02 ENCOUNTER — Other Ambulatory Visit: Payer: Self-pay

## 2019-10-02 ENCOUNTER — Encounter: Payer: Self-pay | Admitting: Urology

## 2019-10-02 VITALS — BP 171/76 | HR 61 | Temp 96.3°F | Ht 68.0 in | Wt 198.0 lb

## 2019-10-02 DIAGNOSIS — C61 Malignant neoplasm of prostate: Secondary | ICD-10-CM

## 2019-10-02 NOTE — Patient Instructions (Signed)
Prostate Cancer  The prostate is a male gland that helps make semen. Prostate cancer is when abnormal cells grow in this gland. Follow these instructions at home:  Take over-the-counter and prescription medicines only as told by your doctor.  Eat a healthy diet.  Get plenty of sleep.  Ask your doctor for help to find a support group for men with prostate cancer.  Keep all follow-up visits as told by your doctor. This is important.  If you have to go to the hospital, let your cancer doctor (oncologist) know.  Touch, hold, hug, and caress your partner to continue to show sexual feelings. Contact a doctor if:  You have trouble peeing (urinating).  You have blood in your pee (urine).  You have pain in your hips, back, or chest. Get help right away if:  You have weakness in your legs.  You lose feeling (have numbness) in your legs.  You cannot control your pee or your poop (stool).  You have trouble breathing.  You have sudden pain in your chest.  You have chills or a fever. Summary  The prostate is a male gland that helps make semen. Prostate cancer is when abnormal cells grow in this gland.  Ask your doctor for help to find a support group for men with prostate cancer.  Contact a doctor if you have problems peeing or have any new pain that you did not have before. This information is not intended to replace advice given to you by your health care provider. Make sure you discuss any questions you have with your health care provider. Document Revised: 08/11/2017 Document Reviewed: 05/09/2016 Elsevier Patient Education  2020 Elsevier Inc.  

## 2019-10-02 NOTE — Progress Notes (Signed)
10/02/2019 11:28 AM   Steven Chapman [birth year] [id number]  Referring provider: Neale Burly, MD [address] [place],  [place] [phone]  Prostate Cancer  HPI: Mr Reisman is a 79yo here for followup for prostate cancer. He has been followed by me since 11/2016  His records from [facility] are as follows: 11/23/2016: He was diagnosed was Gleason 3+3=6 in 1/16 cores 10% of core in 2016. He was then followed by Dr. Exie Parody since then. He has not had a repeat biopsy. PSa has risen to 6.3.   12/07/2016: pt here for repeat prostate biopsy   12/21/2016: Repeat biopsy showed Gleason 3+3=6 2/12 cores 20% of core.   08/30/2017: PSA is stable at 6.3 from 06/26/2017. no worsening LUTS. He is not bothered by his LUTS.   02/28/2018: He has stable LUTS since last visit. PSA is 6.9   08/29/2018: PSa is stable at 6.8   02/27/2019: PSA increased to 7.7. No new LUTS    PSA was 8.2 in 08/2019. No new LUTS. No other associated symptoms.    PMH: Past Medical History:  Diagnosis Date  . Hypertension     Surgical History: No past surgical history on file.  Home Medications:  Allergies as of 10/02/2019   No Known Allergies     Medication List       Accurate as of October 02, 2019 11:28 AM. If you have any questions, ask your nurse or doctor.        amLODipine 10 MG tablet Commonly known as: NORVASC Take 10 mg by mouth daily.   ascorbic acid 100 MG tablet Commonly known as: VITAMIN C Take by mouth.   Calcium Carb-Ergocalciferol 500-200 MG-UNIT Tabs Take by mouth.   Flucelvax Quadrivalent 0.5 ML injection Generic drug: influenza vaccine TO BE ADMINISTERED BY IMMUNIZER   lisinopril-hydrochlorothiazide 20-25 MG tablet Commonly known as: ZESTORETIC Take by mouth.   meloxicam 7.5 MG tablet Commonly known as: MOBIC       Allergies: No Known Allergies  Family History: No family history on file.  Social History:  has no history on file for tobacco, alcohol, and  drug.  ROS: All other review of systems were reviewed and are negative except what is noted above in HPI  Physical Exam: BP (!) 171/76   Pulse 61   Temp (!) 96.3 F (35.7 C)   Ht 5\' 8"  (1.727 m)   Wt 198 lb (89.8 kg)   BMI 30.11 kg/m   Constitutional:  Alert and oriented, No acute distress. HEENT:  AT, moist mucus membranes.  Trachea midline, no masses. Cardiovascular: No clubbing, cyanosis, or edema. Respiratory: Normal respiratory effort, no increased work of breathing. GI: Abdomen is soft, nontender, nondistended, no abdominal masses GU: No CVA tenderness Lymph: No cervical or inguinal lymphadenopathy. Skin: No rashes, bruises or suspicious lesions. Neurologic: Grossly intact, no focal deficits, moving all 4 extremities. Psychiatric: Normal mood and affect.  Laboratory Data: No results found for: WBC, HGB, HCT, MCV, PLT  No results found for: CREATININE  No results found for: PSA  No results found for: TESTOSTERONE  No results found for: HGBA1C  Urinalysis    Component Value Date/Time   COLORURINE STRAW (A) 11/23/2016 [place] 11/23/2016 1513   LABSPEC 1.008 11/23/2016 1513   PHURINE 7.0 11/23/2016 1513   [place] 11/23/2016 1513   [place] 11/23/2016 1513   BILIRUBINUR NEGATIVE 11/23/2016 1513   KETONESUR 5 (A) 11/23/2016 1513   PROTEINUR NEGATIVE 11/23/2016  [place] 11/23/2016 [place] 11/23/2016 1513    Lab Results  Component Value Date   BACTERIA NONE SEEN 11/23/2016    Pertinent Imaging:  No results found for this or any previous visit. No results found for this or any previous visit. No results found for this or any previous visit. No results found for this or any previous visit. No results found for this or any previous visit. No results found for this or any previous visit. No results found for this or any previous visit. No results found for this or any previous  visit.  Assessment & Plan:    1. Prostate cancer: -Continue active surveillance -RTC 6 months with PSA. We will repeat biopsy if PSA increases above 10  No follow-ups on file.  Nicolette Bang, MD  [facility] [place]

## 2019-10-02 NOTE — Progress Notes (Signed)
Urological Symptom Review  Patient is experiencing the following symptoms: none   Review of Systems  Gastrointestinal (upper)  : Negative for upper GI symptoms  Gastrointestinal (lower) : Constipation  Constitutional : Negative for symptoms  Skin: Negative for skin symptoms  Eyes: Negative for eye symptoms  Ear/Nose/Throat : Negative for Ear/Nose/Throat symptoms  Hematologic/Lymphatic: Easy bruising  Cardiovascular : Negative for cardiovascular symptoms  Respiratory : Negative for respiratory symptoms  Endocrine: Negative for endocrine symptoms  Musculoskeletal: Negative for musculoskeletal symptoms  Neurological: Negative for neurological symptoms  Psychologic: Negative for psychiatric symptoms

## 2019-10-10 DIAGNOSIS — M810 Age-related osteoporosis without current pathological fracture: Secondary | ICD-10-CM | POA: Diagnosis not present

## 2019-10-10 DIAGNOSIS — M8589 Other specified disorders of bone density and structure, multiple sites: Secondary | ICD-10-CM | POA: Diagnosis not present

## 2019-10-10 DIAGNOSIS — M8588 Other specified disorders of bone density and structure, other site: Secondary | ICD-10-CM | POA: Diagnosis not present

## 2019-10-23 DIAGNOSIS — F419 Anxiety disorder, unspecified: Secondary | ICD-10-CM | POA: Diagnosis not present

## 2019-10-23 DIAGNOSIS — M179 Osteoarthritis of knee, unspecified: Secondary | ICD-10-CM | POA: Diagnosis not present

## 2019-10-23 DIAGNOSIS — I10 Essential (primary) hypertension: Secondary | ICD-10-CM | POA: Diagnosis not present

## 2019-12-07 DIAGNOSIS — R5381 Other malaise: Secondary | ICD-10-CM | POA: Diagnosis not present

## 2019-12-07 DIAGNOSIS — T679XXA Effect of heat and light, unspecified, initial encounter: Secondary | ICD-10-CM | POA: Diagnosis not present

## 2019-12-07 DIAGNOSIS — R531 Weakness: Secondary | ICD-10-CM | POA: Diagnosis not present

## 2019-12-07 DIAGNOSIS — I10 Essential (primary) hypertension: Secondary | ICD-10-CM | POA: Diagnosis not present

## 2019-12-07 DIAGNOSIS — I4510 Unspecified right bundle-branch block: Secondary | ICD-10-CM | POA: Diagnosis not present

## 2020-01-22 DIAGNOSIS — M179 Osteoarthritis of knee, unspecified: Secondary | ICD-10-CM | POA: Diagnosis not present

## 2020-01-22 DIAGNOSIS — F419 Anxiety disorder, unspecified: Secondary | ICD-10-CM | POA: Diagnosis not present

## 2020-01-22 DIAGNOSIS — I10 Essential (primary) hypertension: Secondary | ICD-10-CM | POA: Diagnosis not present

## 2020-03-26 DIAGNOSIS — C61 Malignant neoplasm of prostate: Secondary | ICD-10-CM | POA: Diagnosis not present

## 2020-04-01 ENCOUNTER — Ambulatory Visit: Payer: Medicare Other | Admitting: Urology

## 2020-04-01 ENCOUNTER — Other Ambulatory Visit: Payer: Self-pay

## 2020-04-01 ENCOUNTER — Encounter: Payer: Self-pay | Admitting: Urology

## 2020-04-01 ENCOUNTER — Ambulatory Visit (INDEPENDENT_AMBULATORY_CARE_PROVIDER_SITE_OTHER): Payer: Medicare Other | Admitting: Urology

## 2020-04-01 VITALS — BP 195/80 | HR 59 | Temp 98.3°F | Ht 68.0 in | Wt 200.0 lb

## 2020-04-01 DIAGNOSIS — C61 Malignant neoplasm of prostate: Secondary | ICD-10-CM | POA: Diagnosis not present

## 2020-04-01 NOTE — Progress Notes (Signed)
04/01/2020 10:54 AM   Steven Chapman Oct 31, 1940 [id number]  Referring provider: Neale Burly, MD [address] [place],  [place] [address]  followup prostate cancer  HPI: Steven Chapman is a 79yo here for followup for prostate cancer. PSA increased to 8.8 from 8.2 last visit. No worsening LUTS. NOcturia 1x. Stream strong. No hematuria or dyuria. No bone pain.    PMH: Past Medical History:  Diagnosis Date  . Hypertension     Surgical History: History reviewed. No pertinent surgical history.  Home Medications:  Allergies as of 04/01/2020   No Known Allergies     Medication List       Accurate as of April 01, 2020 10:54 AM. If you have any questions, ask your nurse or doctor.        amLODipine 10 MG tablet Commonly known as: NORVASC Take 10 mg by mouth daily.   ascorbic acid 100 MG tablet Commonly known as: VITAMIN C Take by mouth.   Calcium Carb-Ergocalciferol 500-200 MG-UNIT Tabs Take by mouth.   Flucelvax Quadrivalent 0.5 ML injection Generic drug: influenza vaccine TO BE ADMINISTERED BY IMMUNIZER   lisinopril-hydrochlorothiazide 20-25 MG tablet Commonly known as: ZESTORETIC Take by mouth.   meloxicam 7.5 MG tablet Commonly known as: MOBIC       Allergies: No Known Allergies  Family History: History reviewed. No pertinent family history.  Social History:  reports that he has quit smoking. He has never used smokeless tobacco. He reports previous alcohol use. He reports that he does not use drugs.  ROS: All other review of systems were reviewed and are negative except what is noted above in HPI  Physical Exam: BP (!) 195/80   Pulse (!) 59   Temp 98.3 F (36.8 C)   Ht 5\' 8"  (1.727 m)   Wt 200 lb (90.7 kg)   BMI 30.41 kg/m   Constitutional:  Alert and oriented, No acute distress. HEENT: [place] AT, moist mucus membranes.  Trachea midline, no masses. Cardiovascular: No clubbing, cyanosis, or edema. Respiratory: Normal respiratory effort, no  increased work of breathing. GI: Abdomen is soft, nontender, nondistended, no abdominal masses GU: No CVA tenderness. Circumcised phallus. No masses/lesions on penis, testis, scrotum. Prostate 40g smooth no nodules no induration.  Lymph: No cervical or inguinal lymphadenopathy. Skin: No rashes, bruises or suspicious lesions. Neurologic: Grossly intact, no focal deficits, moving all 4 extremities. Psychiatric: Normal mood and affect.  Laboratory Data: No results found for: WBC, HGB, HCT, MCV, PLT  No results found for: CREATININE  No results found for: PSA  No results found for: TESTOSTERONE  No results found for: HGBA1C  Urinalysis    Component Value Date/Time   COLORURINE STRAW (A) 11/23/2016 1513   APPEARANCEUR CLEAR 11/23/2016 1513   LABSPEC 1.008 11/23/2016 1513   PHURINE 7.0 11/23/2016 1513   GLUCOSEU NEGATIVE 11/23/2016 1513   HGBUR NEGATIVE 11/23/2016 1513   BILIRUBINUR NEGATIVE 11/23/2016 1513   KETONESUR 5 (A) 11/23/2016 1513   PROTEINUR NEGATIVE 11/23/2016 1513   NITRITE NEGATIVE 11/23/2016 1513   LEUKOCYTESUR NEGATIVE 11/23/2016 1513    Lab Results  Component Value Date   BACTERIA NONE SEEN 11/23/2016    Pertinent Imaging:  No results found for this or any previous visit.  No results found for this or any previous visit.  No results found for this or any previous visit.  No results found for this or any previous visit.  No results found for this or any previous visit.  No  results found for this or any previous visit.  No results found for this or any previous visit.  No results found for this or any previous visit.   Assessment & Plan:    1. Prostate cancer ([place]) -Continue active surveillance -RTC 6 months with PSA   Return in about 6 months (around 10/02/2020) for PSA.  Nicolette Bang, MD  [facility] [place]

## 2020-04-01 NOTE — Progress Notes (Signed)
Urological Symptom Review  Patient is experiencing the following symptoms: None   Review of Systems  Gastrointestinal (upper)  : none  Gastrointestinal (lower) : Negative for lower GI symptoms  Constitutional : Negative for symptoms  Skin: Negative for skin symptoms  Eyes: Negative for eye symptoms  Ear/Nose/Throat : Negative for Ear/Nose/Throat symptoms  Hematologic/Lymphatic: Negative for Hematologic/Lymphatic symptoms  Cardiovascular : Negative for cardiovascular symptoms  Respiratory : Negative for respiratory symptoms  Endocrine: Negative for endocrine symptoms  Musculoskeletal: Negative for musculoskeletal symptoms  Neurological: Negative for neurological symptoms  Psychologic: Negative for psychiatric symptoms

## 2020-04-01 NOTE — Patient Instructions (Signed)
Prostate Cancer  The prostate is a male gland that helps make semen. Prostate cancer is when abnormal cells grow in this gland. Follow these instructions at home:  Take over-the-counter and prescription medicines only as told by your doctor.  Eat a healthy diet.  Get plenty of sleep.  Ask your doctor for help to find a support group for men with prostate cancer.  Keep all follow-up visits as told by your doctor. This is important.  If you have to go to the hospital, let your cancer doctor (oncologist) know.  Touch, hold, hug, and caress your partner to continue to show sexual feelings. Contact a doctor if:  You have trouble peeing (urinating).  You have blood in your pee (urine).  You have pain in your hips, back, or chest. Get help right away if:  You have weakness in your legs.  You lose feeling (have numbness) in your legs.  You cannot control your pee or your poop (stool).  You have trouble breathing.  You have sudden pain in your chest.  You have chills or a fever. Summary  The prostate is a male gland that helps make semen. Prostate cancer is when abnormal cells grow in this gland.  Ask your doctor for help to find a support group for men with prostate cancer.  Contact a doctor if you have problems peeing or have any new pain that you did not have before. This information is not intended to replace advice given to you by your health care provider. Make sure you discuss any questions you have with your health care provider. Document Revised: 08/11/2017 Document Reviewed: 05/09/2016 Elsevier Patient Education  2020 Elsevier Inc.  

## 2020-04-29 DIAGNOSIS — I10 Essential (primary) hypertension: Secondary | ICD-10-CM | POA: Diagnosis not present

## 2020-04-29 DIAGNOSIS — M179 Osteoarthritis of knee, unspecified: Secondary | ICD-10-CM | POA: Diagnosis not present

## 2020-04-29 DIAGNOSIS — F419 Anxiety disorder, unspecified: Secondary | ICD-10-CM | POA: Diagnosis not present

## 2020-06-27 DIAGNOSIS — Z23 Encounter for immunization: Secondary | ICD-10-CM | POA: Diagnosis not present

## 2020-07-30 DIAGNOSIS — Z0000 Encounter for general adult medical examination without abnormal findings: Secondary | ICD-10-CM | POA: Diagnosis not present

## 2020-07-30 DIAGNOSIS — F419 Anxiety disorder, unspecified: Secondary | ICD-10-CM | POA: Diagnosis not present

## 2020-07-30 DIAGNOSIS — M179 Osteoarthritis of knee, unspecified: Secondary | ICD-10-CM | POA: Diagnosis not present

## 2020-07-30 DIAGNOSIS — I10 Essential (primary) hypertension: Secondary | ICD-10-CM | POA: Diagnosis not present

## 2020-07-30 DIAGNOSIS — Z1331 Encounter for screening for depression: Secondary | ICD-10-CM | POA: Diagnosis not present

## 2020-09-01 DIAGNOSIS — H35033 Hypertensive retinopathy, bilateral: Secondary | ICD-10-CM | POA: Diagnosis not present

## 2020-10-08 ENCOUNTER — Ambulatory Visit: Payer: Medicare Other | Admitting: Urology

## 2020-10-16 ENCOUNTER — Other Ambulatory Visit: Payer: Self-pay

## 2020-10-16 ENCOUNTER — Encounter: Payer: Self-pay | Admitting: Urology

## 2020-10-16 ENCOUNTER — Ambulatory Visit (INDEPENDENT_AMBULATORY_CARE_PROVIDER_SITE_OTHER): Payer: Medicare Other | Admitting: Urology

## 2020-10-16 VITALS — BP 207/74 | HR 52 | Temp 99.1°F | Ht 68.0 in | Wt 200.0 lb

## 2020-10-16 DIAGNOSIS — C61 Malignant neoplasm of prostate: Secondary | ICD-10-CM

## 2020-10-16 LAB — URINALYSIS, ROUTINE W REFLEX MICROSCOPIC
Bilirubin, UA: NEGATIVE
Glucose, UA: NEGATIVE
Ketones, UA: NEGATIVE
Leukocytes,UA: NEGATIVE
Nitrite, UA: NEGATIVE
Protein,UA: NEGATIVE
RBC, UA: NEGATIVE
Specific Gravity, UA: 1.015 (ref 1.005–1.030)
Urobilinogen, Ur: 1 mg/dL (ref 0.2–1.0)
pH, UA: 7.5 (ref 5.0–7.5)

## 2020-10-16 NOTE — Progress Notes (Signed)
10/16/2020 11:22 AM   Steven Chapman April 18, 1941 [id number]  Referring provider: Neale Burly, MD [address] [place],  [place] [address]  Prostate Cancer  HPI: Steven Chapman is a 80yo her for followup for prostate cancer. PSA decreased to 7.0 from 8.2. No worsening LUTS. No new complaints today. No bone pain. No hematuria or dysuria.    PMH: Past Medical History:  Diagnosis Date  . Arthritis   . Hypertension   . Hypothyroidism   . Prostate cancer [facility])     Surgical History: Past Surgical History:  Procedure Laterality Date  . STOMACH SURGERY  2019    Home Medications:  Allergies as of 10/16/2020   No Known Allergies     Medication List       Accurate as of October 16, 2020 11:22 AM. If you have any questions, ask your nurse or doctor.        amLODipine 10 MG tablet Commonly known as: NORVASC Take 10 mg by mouth daily.   ascorbic acid 100 MG tablet Commonly known as: VITAMIN C Take by mouth.   Calcium Carb-Ergocalciferol 500-200 MG-UNIT Tabs Take by mouth.   Flucelvax Quadrivalent 0.5 ML injection Generic drug: influenza vaccine TO BE ADMINISTERED BY IMMUNIZER   lisinopril-hydrochlorothiazide 20-25 MG tablet Commonly known as: ZESTORETIC Take by mouth.   losartan-hydrochlorothiazide 100-25 MG tablet Commonly known as: HYZAAR Take 1 tablet by mouth daily.   meloxicam 7.5 MG tablet Commonly known as: MOBIC       Allergies: No Known Allergies  Family History: History reviewed. No pertinent family history.  Social History:  reports that he has quit smoking. His smokeless tobacco use includes chew. He reports previous alcohol use. He reports that he does not use drugs.  ROS: All other review of systems were reviewed and are negative except what is noted above in HPI  Physical Exam: BP (!) 207/74   Pulse (!) 52   Temp 99.1 F (37.3 C)   Ht 5\' 8"  (1.727 m)   Wt 200 lb (90.7 kg)   BMI 30.41 kg/m   Constitutional:  Alert and oriented,  No acute distress. HEENT:  AT, moist mucus membranes.  Trachea midline, no masses. Cardiovascular: No clubbing, cyanosis, or edema. Respiratory: Normal respiratory effort, no increased work of breathing. GI: Abdomen is soft, nontender, nondistended, no abdominal masses GU: No CVA tenderness.  Lymph: No cervical or inguinal lymphadenopathy. Skin: No rashes, bruises or suspicious lesions. Neurologic: Grossly intact, no focal deficits, moving all 4 extremities. Psychiatric: Normal mood and affect.  Laboratory Data: No results found for: WBC, HGB, HCT, MCV, PLT  No results found for: CREATININE  No results found for: PSA  No results found for: TESTOSTERONE  No results found for: HGBA1C  Urinalysis    Component Value Date/Time   COLORURINE STRAW (A) 11/23/2016 1513   APPEARANCEUR CLEAR 11/23/2016 1513   LABSPEC 1.008 11/23/2016 1513   PHURINE 7.0 11/23/2016 1513   GLUCOSEU NEGATIVE 11/23/2016 1513   HGBUR NEGATIVE 11/23/2016 1513   BILIRUBINUR NEGATIVE 11/23/2016 1513   KETONESUR 5 (A) 11/23/2016 1513   PROTEINUR NEGATIVE 11/23/2016 1513   NITRITE NEGATIVE 11/23/2016 1513   LEUKOCYTESUR NEGATIVE 11/23/2016 1513    Lab Results  Component Value Date   BACTERIA NONE SEEN 11/23/2016    Pertinent Imaging:  No results found for this or any previous visit.  No results found for this or any previous visit.  No results found for this or any previous visit.  No results found for this or any previous visit.  No results found for this or any previous visit.  No results found for this or any previous visit.  No results found for this or any previous visit.  No results found for this or any previous visit.   Assessment & Plan:    1. Prostate cancer ([place]) -Continue active surveillance -RTC 6 months with PSA and DRE - Urinalysis, Routine w reflex microscopic   No follow-ups on file.  Nicolette Bang, MD  [facility] [place]

## 2020-10-16 NOTE — Patient Instructions (Signed)
Prostate Cancer  The prostate is a male gland that helps make semen. It is located below a man's bladder, in front of the rectum. Prostate cancer is when abnormal cells grow in this gland. What are the causes? The cause of this condition is not known. What increases the risk? You are more likely to develop this condition if:  You are 80 years of age or older.  You are African American.  You have a family history of prostate cancer.  You have a family history of breast cancer. What are the signs or symptoms? Symptoms of this condition include:  A need to pee often.  Peeing that is weak, or pee that stops and starts.  Trouble starting or stopping your pee.  Inability to pee.  Blood in your pee or semen.  Pain in the lower back, lower belly (abdomen), hips, or upper thighs.  Trouble getting an erection.  Trouble emptying all of your pee. How is this treated? Treatment for this condition depends on your age, your health, the kind of treatment you like, and how far the cancer has spread. Treatments include:  Being watched. This is called observation. You will be tested from time to time, but you will not get treated. Tests are to make sure that the cancer is not growing.  Surgery. This may be done to remove the prostate, to remove the testicles, or to freeze or kill cancer cells.  Radiation. This uses a strong beam to kill cancer cells.  Ultrasound energy. This uses strong sound waves to kill cancer cells.  Chemotherapy. This uses medicines that stop cancer cells from increasing. This kills cancer cells and healthy cells.  Targeted therapy. This kills cancer cells only. Healthy cells are not affected.  Hormone treatment. This stops the body from making hormones that help the cancer cells to grow. Follow these instructions at home:  Take over-the-counter and prescription medicines only as told by your doctor.  Eat a healthy diet.  Get plenty of sleep.  Ask your  doctor for help to find a support group for men with prostate cancer.  If you have to go to the hospital, let your cancer doctor (oncologist) know.  Treatment may affect your ability to have sex. Touch, hold, hug, and caress your partner to have intimate moments.  Keep all follow-up visits as told by your doctor. This is important. Contact a doctor if:  You have new or more trouble peeing.  You have new or more blood in your pee.  You have new or more pain in your hips, back, or chest. Get help right away if:  You have weakness in your legs.  You lose feeling in your legs.  You cannot control your pee or your poop (stool).  You have chills or a fever. Summary  The prostate is a male gland that helps make semen.  Prostate cancer is when abnormal cells grow in this gland.  Treatment includes doing surgery, using medicines, using very strong beams, or watching without treatment.  Ask your doctor for help to find a support group for men with prostate cancer.  Contact a doctor if you have problems peeing or have any new pain that you did not have before. This information is not intended to replace advice given to you by your health care provider. Make sure you discuss any questions you have with your health care provider. Document Revised: 08/13/2019 Document Reviewed: 08/13/2019 Elsevier Patient Education  2021 Elsevier Inc.  

## 2020-10-16 NOTE — Progress Notes (Signed)
Urological Symptom Review  Patient is experiencing the following symptoms: none   Review of Systems  Gastrointestinal (upper)  : Negative for upper GI symptoms  Gastrointestinal (lower) : Constipation  Constitutional : Negative for symptoms  Skin: Negative for skin symptoms  Eyes: Negative for eye symptoms  Ear/Nose/Throat : Negative for Ear/Nose/Throat symptoms  Hematologic/Lymphatic: Negative for Hematologic/Lymphatic symptoms  Cardiovascular : Negative for cardiovascular symptoms  Respiratory : Negative for respiratory symptoms  Endocrine: Negative for endocrine symptoms  Musculoskeletal: Joint pain  Neurological: Negative for neurological symptoms  Psychologic: Negative for psychiatric symptoms

## 2020-10-20 ENCOUNTER — Other Ambulatory Visit: Payer: Self-pay

## 2020-10-27 ENCOUNTER — Telehealth: Payer: Self-pay

## 2020-10-27 NOTE — Telephone Encounter (Signed)
Patient made aware.

## 2020-10-27 NOTE — Telephone Encounter (Signed)
-----   Message from Cleon Gustin, MD sent at 10/27/2020  9:43 AM EST ----- PSa decreased ----- Message ----- From: Iris Pert, LPN Sent: 7/94/8016   4:12 PM EST To: Cleon Gustin, MD, Dorisann Frames, RN  Please review

## 2021-02-01 ENCOUNTER — Inpatient Hospital Stay (HOSPITAL_COMMUNITY): Payer: Medicare Other

## 2021-02-01 ENCOUNTER — Inpatient Hospital Stay (HOSPITAL_COMMUNITY)
Admission: AD | Admit: 2021-02-01 | Discharge: 2021-02-10 | DRG: 291 | Disposition: E | Payer: Medicare Other | Source: Other Acute Inpatient Hospital | Attending: Pulmonary Disease | Admitting: Pulmonary Disease

## 2021-02-01 ENCOUNTER — Other Ambulatory Visit (HOSPITAL_COMMUNITY): Payer: Medicare Other

## 2021-02-01 DIAGNOSIS — Z9049 Acquired absence of other specified parts of digestive tract: Secondary | ICD-10-CM

## 2021-02-01 DIAGNOSIS — G9341 Metabolic encephalopathy: Secondary | ICD-10-CM | POA: Diagnosis present

## 2021-02-01 DIAGNOSIS — I469 Cardiac arrest, cause unspecified: Secondary | ICD-10-CM | POA: Diagnosis not present

## 2021-02-01 DIAGNOSIS — Z4659 Encounter for fitting and adjustment of other gastrointestinal appliance and device: Secondary | ICD-10-CM

## 2021-02-01 DIAGNOSIS — I5032 Chronic diastolic (congestive) heart failure: Secondary | ICD-10-CM | POA: Diagnosis present

## 2021-02-01 DIAGNOSIS — N179 Acute kidney failure, unspecified: Secondary | ICD-10-CM | POA: Diagnosis not present

## 2021-02-01 DIAGNOSIS — Z7289 Other problems related to lifestyle: Secondary | ICD-10-CM | POA: Diagnosis not present

## 2021-02-01 DIAGNOSIS — R7401 Elevation of levels of liver transaminase levels: Secondary | ICD-10-CM

## 2021-02-01 DIAGNOSIS — J690 Pneumonitis due to inhalation of food and vomit: Secondary | ICD-10-CM | POA: Diagnosis present

## 2021-02-01 DIAGNOSIS — Z515 Encounter for palliative care: Secondary | ICD-10-CM | POA: Diagnosis not present

## 2021-02-01 DIAGNOSIS — I5033 Acute on chronic diastolic (congestive) heart failure: Secondary | ICD-10-CM | POA: Diagnosis present

## 2021-02-01 DIAGNOSIS — E039 Hypothyroidism, unspecified: Secondary | ICD-10-CM | POA: Diagnosis present

## 2021-02-01 DIAGNOSIS — F419 Anxiety disorder, unspecified: Secondary | ICD-10-CM | POA: Diagnosis present

## 2021-02-01 DIAGNOSIS — R9431 Abnormal electrocardiogram [ECG] [EKG]: Secondary | ICD-10-CM | POA: Diagnosis present

## 2021-02-01 DIAGNOSIS — Z8546 Personal history of malignant neoplasm of prostate: Secondary | ICD-10-CM

## 2021-02-01 DIAGNOSIS — R0603 Acute respiratory distress: Secondary | ICD-10-CM

## 2021-02-01 DIAGNOSIS — I110 Hypertensive heart disease with heart failure: Principal | ICD-10-CM | POA: Diagnosis present

## 2021-02-01 DIAGNOSIS — Z66 Do not resuscitate: Secondary | ICD-10-CM | POA: Diagnosis not present

## 2021-02-01 DIAGNOSIS — F17220 Nicotine dependence, chewing tobacco, uncomplicated: Secondary | ICD-10-CM | POA: Diagnosis present

## 2021-02-01 DIAGNOSIS — J9601 Acute respiratory failure with hypoxia: Secondary | ICD-10-CM | POA: Diagnosis present

## 2021-02-01 DIAGNOSIS — Z9911 Dependence on respirator [ventilator] status: Secondary | ICD-10-CM

## 2021-02-01 DIAGNOSIS — J189 Pneumonia, unspecified organism: Secondary | ICD-10-CM | POA: Diagnosis present

## 2021-02-01 DIAGNOSIS — I462 Cardiac arrest due to underlying cardiac condition: Secondary | ICD-10-CM | POA: Diagnosis present

## 2021-02-01 LAB — ABO/RH: ABO/RH(D): O NEG

## 2021-02-01 LAB — COMPREHENSIVE METABOLIC PANEL
ALT: 34 U/L (ref 0–44)
AST: 22 U/L (ref 15–41)
Albumin: 3.1 g/dL — ABNORMAL LOW (ref 3.5–5.0)
Alkaline Phosphatase: 50 U/L (ref 38–126)
Anion gap: 8 (ref 5–15)
BUN: 19 mg/dL (ref 8–23)
CO2: 29 mmol/L (ref 22–32)
Calcium: 8.6 mg/dL — ABNORMAL LOW (ref 8.9–10.3)
Chloride: 104 mmol/L (ref 98–111)
Creatinine, Ser: 1.48 mg/dL — ABNORMAL HIGH (ref 0.61–1.24)
GFR, Estimated: 48 mL/min — ABNORMAL LOW (ref >60)
Glucose, Bld: 107 mg/dL — ABNORMAL HIGH (ref 70–99)
Potassium: 3.5 mmol/L (ref 3.5–5.1)
Sodium: 141 mmol/L (ref 135–145)
Total Bilirubin: 0.9 mg/dL (ref 0.3–1.2)
Total Protein: 5.4 g/dL — ABNORMAL LOW (ref 6.5–8.1)

## 2021-02-01 LAB — TYPE AND SCREEN
ABO/RH(D): O NEG
Antibody Screen: NEGATIVE

## 2021-02-01 LAB — MRSA PCR SCREENING: MRSA by PCR: NEGATIVE

## 2021-02-01 LAB — BLOOD GAS, VENOUS
Acid-Base Excess: 2.1 mmol/L — ABNORMAL HIGH (ref 0.0–2.0)
Bicarbonate: 26.2 mmol/L (ref 20.0–28.0)
O2 Saturation: 97.4 %
Patient temperature: 37
pCO2, Ven: 40.9 mmHg — ABNORMAL LOW (ref 44.0–60.0)
pH, Ven: 7.422 (ref 7.250–7.430)

## 2021-02-01 LAB — URINALYSIS, ROUTINE W REFLEX MICROSCOPIC
Bilirubin Urine: NEGATIVE
Glucose, UA: NEGATIVE mg/dL
Ketones, ur: NEGATIVE mg/dL
Leukocytes,Ua: NEGATIVE
Nitrite: NEGATIVE
Protein, ur: NEGATIVE mg/dL
RBC / HPF: >50 RBC/hpf — ABNORMAL HIGH (ref 0–5)
Specific Gravity, Urine: 1.015 (ref 1.005–1.030)
pH: 5 (ref 5.0–8.0)

## 2021-02-01 LAB — LIPID PANEL
Cholesterol: 156 mg/dL (ref 0–200)
HDL: 54 mg/dL (ref >40)
LDL Cholesterol: 90 mg/dL (ref 0–99)
Total CHOL/HDL Ratio: 2.9 RATIO
Triglycerides: 58 mg/dL (ref <150)
VLDL: 12 mg/dL (ref 0–40)

## 2021-02-01 LAB — PHOSPHORUS: Phosphorus: 4.1 mg/dL (ref 2.5–4.6)

## 2021-02-01 LAB — LACTIC ACID, PLASMA
Lactic Acid, Venous: 1 mmol/L (ref 0.5–1.9)
Lactic Acid, Venous: 2.4 mmol/L (ref 0.5–1.9)

## 2021-02-01 LAB — PROCALCITONIN: Procalcitonin: 0.59 ng/mL

## 2021-02-01 LAB — BRAIN NATRIURETIC PEPTIDE: B Natriuretic Peptide: 775.1 pg/mL — ABNORMAL HIGH (ref 0.0–100.0)

## 2021-02-01 LAB — PROTIME-INR
INR: 1.2 (ref 0.8–1.2)
Prothrombin Time: 15.1 seconds (ref 11.4–15.2)

## 2021-02-01 LAB — MAGNESIUM: Magnesium: 2.1 mg/dL (ref 1.7–2.4)

## 2021-02-01 MED ORDER — SODIUM CHLORIDE 0.9 % IV SOLN
INTRAVENOUS | Status: DC | PRN
  Administered 2021-02-01: 500 mL via INTRAVENOUS

## 2021-02-01 MED ORDER — FENTANYL CITRATE (PF) 100 MCG/2ML IJ SOLN
25.0000 ug | INTRAMUSCULAR | Status: DC | PRN
  Administered 2021-02-01 – 2021-02-02 (×2): 100 ug via INTRAVENOUS
  Filled 2021-02-01 (×3): qty 2

## 2021-02-01 MED ORDER — POTASSIUM CHLORIDE 10 MEQ/50ML IV SOLN
10.0000 meq | INTRAVENOUS | Status: AC
  Administered 2021-02-01 (×4): 10 meq via INTRAVENOUS
  Filled 2021-02-01 (×4): qty 50

## 2021-02-01 MED ORDER — BUDESONIDE 0.25 MG/2ML IN SUSP
0.2500 mg | Freq: Two times a day (BID) | RESPIRATORY_TRACT | Status: DC
  Administered 2021-02-01 – 2021-02-03 (×4): 0.25 mg via RESPIRATORY_TRACT
  Filled 2021-02-01 (×4): qty 2

## 2021-02-01 MED ORDER — FUROSEMIDE 10 MG/ML IJ SOLN
40.0000 mg | Freq: Two times a day (BID) | INTRAMUSCULAR | Status: DC
  Administered 2021-02-01 – 2021-02-03 (×4): 40 mg via INTRAVENOUS
  Filled 2021-02-01 (×4): qty 4

## 2021-02-01 MED ORDER — DOCUSATE SODIUM 50 MG/5ML PO LIQD
100.0000 mg | Freq: Two times a day (BID) | ORAL | Status: DC
  Administered 2021-02-01 – 2021-02-03 (×4): 100 mg
  Filled 2021-02-01 (×4): qty 10

## 2021-02-01 MED ORDER — POLYETHYLENE GLYCOL 3350 17 G PO PACK: 17.0000 g | PACK | Freq: Every day | ORAL | Status: DC | PRN

## 2021-02-01 MED ORDER — DOCUSATE SODIUM 100 MG PO CAPS: 100.0000 mg | ORAL_CAPSULE | Freq: Two times a day (BID) | ORAL | Status: DC | PRN

## 2021-02-01 MED ORDER — FENTANYL CITRATE (PF) 100 MCG/2ML IJ SOLN
25.0000 ug | INTRAMUSCULAR | Status: DC | PRN
  Administered 2021-02-01: 25 ug via INTRAVENOUS
  Filled 2021-02-01: qty 2

## 2021-02-01 MED ORDER — ALBUTEROL SULFATE (2.5 MG/3ML) 0.083% IN NEBU: 2.5000 mg | INHALATION_SOLUTION | Freq: Four times a day (QID) | RESPIRATORY_TRACT | Status: DC | PRN

## 2021-02-01 MED ORDER — POLYETHYLENE GLYCOL 3350 17 G PO PACK
17.0000 g | PACK | Freq: Every day | ORAL | Status: DC
  Administered 2021-02-03: 17 g
  Filled 2021-02-01: qty 1

## 2021-02-01 MED ORDER — PROPOFOL 1000 MG/100ML IV EMUL
0.0000 ug/kg/min | INTRAVENOUS | Status: DC
  Administered 2021-02-01: 50 ug/kg/min via INTRAVENOUS
  Administered 2021-02-01: 35 ug/kg/min via INTRAVENOUS
  Administered 2021-02-02 (×3): 50 ug/kg/min via INTRAVENOUS
  Filled 2021-02-01: qty 100
  Filled 2021-02-01: qty 200
  Filled 2021-02-01: qty 100
  Filled 2021-02-01: qty 200
  Filled 2021-02-01: qty 100

## 2021-02-01 MED ORDER — HEPARIN SODIUM (PORCINE) 5000 UNIT/ML IJ SOLN
5000.0000 [IU] | Freq: Three times a day (TID) | INTRAMUSCULAR | Status: DC
  Administered 2021-02-01 – 2021-02-02 (×3): 5000 [IU] via SUBCUTANEOUS
  Filled 2021-02-01 (×3): qty 1

## 2021-02-01 MED ORDER — HYDRALAZINE HCL 20 MG/ML IJ SOLN: 10.0000 mg | Freq: Four times a day (QID) | INTRAMUSCULAR | Status: DC | PRN

## 2021-02-01 NOTE — H&P (Addendum)
NAME:  Steven Chapman, MRN:  [id number], DOB:  07/04/1941, LOS: 0 ADMISSION DATE:  01/12/2021, CONSULTATION DATE:  02/08/2021 REFERRING MD: Georgette Dover, CHIEF COMPLAINT:  Cardiac arrest     History of Present Illness:  Steven Chapman is a 80 y.o. male with a PMH significant for anxiety, HTN, prior SBO s/p colectomy, CHF, and prior cancer who presented to [facility] initially with complaints of increased shortness of breath and lower extremity edema that began several days prior to admission. History obtained per chart review as patient is currently intubated an sedated. While in the ED at [facility] patient lost consciousness where he was seen in PEA cardiac arrest. He received 10 minuets of CPR (unsure if he received any ACLS medications) before ROSC was achieved. Patient did not require intubation as he rapidly awoke and was able to protect his airway initially. During ED stay it appears that patient again developed respiratory distress and required BIPAP therapy. Given acute illness patient was accepted in transfer to [facility] health for further care. Patient was intubated for airway protection prior to transfer.   On arrival he is seen sedated on both propofol and versed drip. He is hemodynamically stable and mildly bradycardiac. Repeat labs and imagine currently pending   Pertinent  Medical History  Anxiety HTN Prior SBO s/p colectomy CHF Prior cancer  Significant Hospital Events: Including procedures, antibiotic start and stop dates in addition to other pertinent events   5/23 transferred from [facility] post PEA cardiac arrest   Interim History / Subjective:  Sedate on vent   Objective   Blood pressure 136/67, pulse (!) 50, resp. rate (!) 22, weight 97.5 kg, SpO2 100 %.    Vent Mode: PRVC FiO2 (%):  [100 %] 100 % Set Rate:  [18 bmp] 18 bmp Vt Set:  [550 mL] 550 mL PEEP:  [5 cmH20] 5 cmH20 Plateau Pressure:  [27 cmH20] 27 cmH20  No intake or output data in the 24 hours ending 01/18/2021 1223 Filed Weights    01/25/2021 1200  Weight: 97.5 kg    Examination: General: Acute on chronic ill appearing elderly male on mechanical ventilation, in NAD HEENT: ETT, MM pink/moist, pupils 68mm and sluggish  Neuro: Sedated on propofol and versed  CV: s1s2 regular rate and rhythm, no murmur, rubs, or gallops,  PULM:  Bilateral wheezing with diminished air entry GI: soft, bowel sounds active in all 4 quadrants, non-tender, non-distended Extremities: warm/dry, no edema  Skin: no rashes or lesions  Labs/imaging that I have personally reviewed    CXR at OSH with cardiac enlargement and vascular congestions   Head CT at OSH negative   Resolved Hospital Problem list     Assessment & Plan:  Cardiac arrest - Concern for volume overload leading to respiratory distress as arrest cause  - Patient was awake and follow commands post arrest but later developed come AMS prompting BIPAP therapy and later intubation prior to transfer  Concern for underlying CHF - Patient was seen at [facility] 5/11 for complaints of increased lower extremity edema with associated dyspnea. Patient refused admission at that time and was discharged form the ED P: Acute hypoxic respiratory faliure secodary to acute decompensated systolic (HFrEF) heart failure:  On admission EKG: Chest x-ray: ECHO: Troponins: BNP: P: Continuous telemetry  Obtain lipid panel  Strict intake and output  ECHO  Diurese as able   Daily weight to assess volume status Diurese with the use of IV lasix  Closely monitor renal function and electrolytes  Supplemtal oxygen support as below   Trent CVP   Acute respiratory insufficiency -In the setting cardiac arrest and high suspension for pulmonary edema   P: Continue ventilator support with lung protective strategies  Wean PEEP and FiO2 for sats greater than 90%. Head of bed elevated 30 degrees. Plateau pressures less than 30 cm H20.  Obtain chest x-ray and ABG.   Ensure adequate pulmonary hygiene  Follow  cultures  VAP bundle in place  PAD protocol Assess imagine and CBC to determine need for ABT  Hx of HTN  -Per care everywhere homemedications include labetalol, hydralazine, lasix, Benicar,   P: Hold home medications  PRN IV antihypertensives  Resume home meds as able   Hx of bowel obstruction s/p colectomy with abdominal distension on admisison  P: Obtain KUB OG to [employer]   Best practice   Diet:  NPO Pain/Anxiety/Delirium protocol (if indicated): Yes (RASS goal -1) VAP protocol (if indicated): Yes DVT prophylaxis: Subcutaneous Heparin GI prophylaxis: PPI Glucose control:  SSI Yes Central venous access:  Yes, and it is still needed Arterial line:  N/A Foley:  Yes, and it is still needed Mobility:  bed rest  PT consulted: N/A Last date of multidisciplinary goals of care discussion Pending Code Status:  full code  Disposition: ICU  Labs   CBC: No results for input(s): WBC, NEUTROABS, HGB, HCT, MCV, PLT in the last 168 hours.  Basic Metabolic Panel: No results for input(s): NA, K, CL, CO2, GLUCOSE, BUN, CREATININE, CALCIUM, MG, PHOS in the last 168 hours. GFR: CrCl cannot be calculated (No successful lab value found.). No results for input(s): PROCALCITON, WBC, LATICACIDVEN in the last 168 hours.  Liver Function Tests: No results for input(s): AST, ALT, ALKPHOS, BILITOT, PROT, ALBUMIN in the last 168 hours. No results for input(s): LIPASE, AMYLASE in the last 168 hours. No results for input(s): AMMONIA in the last 168 hours.  ABG No results found for: PHART, PCO2ART, PO2ART, HCO3, TCO2, ACIDBASEDEF, O2SAT   Coagulation Profile: No results for input(s): INR, PROTIME in the last 168 hours.  Cardiac Enzymes: No results for input(s): CKTOTAL, CKMB, CKMBINDEX, TROPONINI in the last 168 hours.  HbA1C: No results found for: HGBA1C  CBG: No results for input(s): GLUCAP in the last 168 hours.  Review of Systems:   Unable to assess   Past Medical History:  He,   has a past medical history of Arthritis, Hypertension, Hypothyroidism, and Prostate cancer (Steven Chapman).   Surgical History:   Past Surgical History:  Procedure Laterality Date  . STOMACH SURGERY  2019     Social History:   reports that he has quit smoking. His smokeless tobacco use includes chew. He reports previous alcohol use. He reports that he does not use drugs.   Family History:  His family history is not on file.   Allergies No Known Allergies   Home Medications  Prior to Admission medications   Medication Sig Start Date End Date Taking? Authorizing Provider  amLODipine (NORVASC) 10 MG tablet Take 10 mg by mouth daily. 09/19/19   [provider]  ascorbic acid (VITAMIN C) 100 MG tablet Take by mouth.    [provider]  Calcium Carb-Ergocalciferol 500-200 MG-UNIT TABS Take by mouth.    [provider]  influenza vaccine (FLUCELVAX QUADRIVALENT) 0.5 ML injection TO BE ADMINISTERED BY IMMUNIZER 05/24/18   [provider]  lisinopril-hydrochlorothiazide (ZESTORETIC) 20-25 MG tablet Take by mouth. Patient not taking: Reported on 10/16/2020 06/27/18   [provider]  losartan-hydrochlorothiazide (HYZAAR) 100-25 MG tablet Take 1 tablet by mouth daily. 07/30/20   [provider]  meloxicam (MOBIC) 7.5 MG tablet  07/31/18   [provider]    Critical care time:    Performed by: Johnsie Cancel  Total critical care time: 50 minutes  Critical care time was exclusive of separately billable procedures and treating other patients.  Critical care was necessary to treat or prevent imminent or life-threatening deterioration.  Critical care was time spent personally by me on the following activities: development of treatment plan with patient and/or surrogate as well as nursing, discussions with consultants, evaluation of patient's response to treatment, examination of patient, obtaining history from patient or surrogate, ordering  and performing treatments and interventions, ordering and review of laboratory studies, ordering and review of radiographic studies, pulse oximetry and re-evaluation of patient's condition.  Johnsie Cancel, NP-C [facility] Personal contact information can be found on Amion  01/29/2021, 1:10 PM    PCCM:  80 yo pmh HTN, h/o SBO, CHF, PEA cardiac arrest, ~82mins of CPR. CXR with BL infiltrates, BL pulmonary edema, AHRF intubated on MV.   BP (!) 107/42   Pulse (!) 49   Resp 18   Ht 5\' 8"  (1.727 m)   Wt 97.5 kg   SpO2 99%   BMI 32.68 kg/m   Gen: elderly male, intubated on life support  HENT: ETT in place  Heart: RRR, s1 s2  Lungs: BL vented breaths  Abd: soft, nt nd   Labs reviewed.  BNP 775  Sodium 141 Scr 1.48  A:  AHRF on MV  Acute metabolic encephalopathy  Possible CHF, acute pulmonary edema HTN  H/o bowel obstruction/ s/p colectomy   P: Remains on full vent support  Wean as able  PAD sedation guidelines  Stop versed infusion  Wean from propofol  Holding home bp meds  Echo pending  Lasix 40mg  BID Follow UOP  This patient is critically ill with multiple organ system failure; which, requires frequent high complexity decision making, assessment, support, evaluation, and titration of therapies. This was completed through the application of advanced monitoring technologies and extensive interpretation of multiple databases. During this encounter critical care time was devoted to patient care services described in this note for 52 minutes.  Garner Nash, DO [facility] 02/05/2021 4:04 PM

## 2021-02-01 NOTE — Plan of Care (Signed)

## 2021-02-02 ENCOUNTER — Inpatient Hospital Stay (HOSPITAL_COMMUNITY): Payer: Medicare Other

## 2021-02-02 DIAGNOSIS — I469 Cardiac arrest, cause unspecified: Secondary | ICD-10-CM | POA: Diagnosis not present

## 2021-02-02 LAB — CBC
HCT: 31.4 % — ABNORMAL LOW (ref 39.0–52.0)
Hemoglobin: 10.1 g/dL — ABNORMAL LOW (ref 13.0–17.0)
MCH: 31.4 pg (ref 26.0–34.0)
MCHC: 32.2 g/dL (ref 30.0–36.0)
MCV: 97.5 fL (ref 80.0–100.0)
Platelets: 139 10*3/uL — ABNORMAL LOW (ref 150–400)
RBC: 3.22 MIL/uL — ABNORMAL LOW (ref 4.22–5.81)
RDW: 14.7 % (ref 11.5–15.5)
WBC: 10.3 10*3/uL (ref 4.0–10.5)
nRBC: 0 % (ref 0.0–0.2)

## 2021-02-02 LAB — POCT I-STAT 7, (LYTES, BLD GAS, ICA,H+H)
Acid-Base Excess: 3 mmol/L — ABNORMAL HIGH (ref 0.0–2.0)
Bicarbonate: 27.8 mmol/L (ref 20.0–28.0)
Calcium, Ion: 1.2 mmol/L (ref 1.15–1.40)
HCT: 30 % — ABNORMAL LOW (ref 39.0–52.0)
Hemoglobin: 10.2 g/dL — ABNORMAL LOW (ref 13.0–17.0)
O2 Saturation: 94 %
Patient temperature: 98
Potassium: 3.7 mmol/L (ref 3.5–5.1)
Sodium: 140 mmol/L (ref 135–145)
TCO2: 29 mmol/L (ref 22–32)
pCO2 arterial: 41.3 mmHg (ref 32.0–48.0)
pH, Arterial: 7.435 (ref 7.350–7.450)
pO2, Arterial: 66 mmHg — ABNORMAL LOW (ref 83.0–108.0)

## 2021-02-02 LAB — GLUCOSE, CAPILLARY
Glucose-Capillary: 106 mg/dL — ABNORMAL HIGH (ref 70–99)
Glucose-Capillary: 108 mg/dL — ABNORMAL HIGH (ref 70–99)

## 2021-02-02 LAB — ECHOCARDIOGRAM COMPLETE
AR max vel: 2.24 cm2
AV Area VTI: 2.18 cm2
AV Area mean vel: 2.01 cm2
AV Mean grad: 8 mmHg
AV Peak grad: 15.7 mmHg
Ao pk vel: 1.98 m/s
Area-P 1/2: 1.78 cm2
Height: 68 in
S' Lateral: 3.5 cm
Weight: 3347.46 oz

## 2021-02-02 LAB — BASIC METABOLIC PANEL
Anion gap: 7 (ref 5–15)
BUN: 18 mg/dL (ref 8–23)
CO2: 27 mmol/L (ref 22–32)
Calcium: 8.4 mg/dL — ABNORMAL LOW (ref 8.9–10.3)
Chloride: 106 mmol/L (ref 98–111)
Creatinine, Ser: 1.35 mg/dL — ABNORMAL HIGH (ref 0.61–1.24)
GFR, Estimated: 53 mL/min — ABNORMAL LOW (ref >60)
Glucose, Bld: 97 mg/dL (ref 70–99)
Potassium: 3.6 mmol/L (ref 3.5–5.1)
Sodium: 140 mmol/L (ref 135–145)

## 2021-02-02 LAB — PHOSPHORUS
Phosphorus: 3.5 mg/dL (ref 2.5–4.6)
Phosphorus: 3.5 mg/dL (ref 2.5–4.6)
Phosphorus: 3.2 mg/dL (ref 2.5–4.6)

## 2021-02-02 LAB — MAGNESIUM
Magnesium: 2.1 mg/dL (ref 1.7–2.4)
Magnesium: 2.1 mg/dL (ref 1.7–2.4)
Magnesium: 2.1 mg/dL (ref 1.7–2.4)

## 2021-02-02 LAB — TRIGLYCERIDES: Triglycerides: 110 mg/dL (ref <150)

## 2021-02-02 MED ORDER — FOLIC ACID 1 MG PO TABS
1.0000 mg | ORAL_TABLET | Freq: Every day | ORAL | Status: DC
  Administered 2021-02-02 – 2021-02-03 (×2): 1 mg
  Filled 2021-02-02 (×2): qty 1

## 2021-02-02 MED ORDER — ENOXAPARIN SODIUM 40 MG/0.4ML IJ SOSY
40.0000 mg | PREFILLED_SYRINGE | INTRAMUSCULAR | Status: DC
  Administered 2021-02-02: 40 mg via SUBCUTANEOUS
  Filled 2021-02-02: qty 0.4

## 2021-02-02 MED ORDER — THIAMINE HCL 100 MG/ML IJ SOLN: 100.0000 mg | Freq: Every day | INTRAMUSCULAR | Status: DC

## 2021-02-02 MED ORDER — FENTANYL BOLUS VIA INFUSION
25.0000 ug | INTRAVENOUS | Status: DC | PRN
  Filled 2021-02-02: qty 100

## 2021-02-02 MED ORDER — POTASSIUM CHLORIDE 20 MEQ PO PACK
40.0000 meq | PACK | Freq: Once | ORAL | Status: AC
  Administered 2021-02-02: 40 meq
  Filled 2021-02-02: qty 2

## 2021-02-02 MED ORDER — SODIUM CHLORIDE 0.9 % IV SOLN
2.0000 g | INTRAVENOUS | Status: DC
  Administered 2021-02-02: 2 g via INTRAVENOUS
  Filled 2021-02-02 (×2): qty 20

## 2021-02-02 MED ORDER — FENTANYL 2500MCG IN NS 250ML (10MCG/ML) PREMIX INFUSION
25.0000 ug/h | INTRAVENOUS | Status: DC
  Administered 2021-02-02: 25 ug/h via INTRAVENOUS
  Administered 2021-02-02: 150 ug/h via INTRAVENOUS
  Administered 2021-02-03: 175 ug/h via INTRAVENOUS
  Filled 2021-02-02 (×3): qty 250

## 2021-02-02 MED ORDER — PROSOURCE TF PO LIQD
45.0000 mL | Freq: Two times a day (BID) | ORAL | Status: DC
  Administered 2021-02-02 – 2021-02-03 (×3): 45 mL
  Filled 2021-02-02 (×3): qty 45

## 2021-02-02 MED ORDER — CHLORHEXIDINE GLUCONATE CLOTH 2 % EX PADS
6.0000 | MEDICATED_PAD | Freq: Every day | CUTANEOUS | Status: DC
  Administered 2021-02-02 – 2021-02-03 (×2): 6 via TOPICAL

## 2021-02-02 MED ORDER — FENTANYL CITRATE (PF) 100 MCG/2ML IJ SOLN
25.0000 ug | Freq: Once | INTRAMUSCULAR | Status: AC
  Administered 2021-02-02: 25 ug via INTRAVENOUS

## 2021-02-02 MED ORDER — VITAL HIGH PROTEIN PO LIQD: 1000.0000 mL | ORAL | Status: DC

## 2021-02-02 MED ORDER — PROPOFOL 1000 MG/100ML IV EMUL
0.0000 ug/kg/min | INTRAVENOUS | Status: DC
  Administered 2021-02-02: 5 ug/kg/min via INTRAVENOUS
  Administered 2021-02-02: 15 ug/kg/min via INTRAVENOUS
  Administered 2021-02-03: 20 ug/kg/min via INTRAVENOUS
  Filled 2021-02-02 (×3): qty 100

## 2021-02-02 MED ORDER — MIDAZOLAM HCL 2 MG/2ML IJ SOLN
1.0000 mg | INTRAMUSCULAR | Status: DC | PRN
  Administered 2021-02-02: 1 mg via INTRAVENOUS
  Filled 2021-02-02: qty 2

## 2021-02-02 MED ORDER — PANTOPRAZOLE SODIUM 40 MG PO PACK
40.0000 mg | PACK | Freq: Every day | ORAL | Status: DC
  Administered 2021-02-02 – 2021-02-03 (×2): 40 mg
  Filled 2021-02-02 (×2): qty 20

## 2021-02-02 MED ORDER — ACETAMINOPHEN 160 MG/5ML PO SOLN
500.0000 mg | Freq: Four times a day (QID) | ORAL | Status: DC | PRN
  Administered 2021-02-03: 500 mg
  Filled 2021-02-02: qty 20.3

## 2021-02-02 MED ORDER — VITAL AF 1.2 CAL PO LIQD
1000.0000 mL | ORAL | Status: DC
  Administered 2021-02-02: 1000 mL

## 2021-02-02 MED ORDER — THIAMINE HCL 100 MG PO TABS
100.0000 mg | ORAL_TABLET | Freq: Every day | ORAL | Status: DC
  Administered 2021-02-02 – 2021-02-03 (×2): 100 mg
  Filled 2021-02-02 (×2): qty 1

## 2021-02-02 MED ORDER — MIDAZOLAM HCL 2 MG/2ML IJ SOLN
INTRAMUSCULAR | Status: AC
  Administered 2021-02-02: 2 mg
  Filled 2021-02-02: qty 2

## 2021-02-02 MED ORDER — ORAL CARE MOUTH RINSE
15.0000 mL | OROMUCOSAL | Status: DC
  Administered 2021-02-02 – 2021-02-03 (×7): 15 mL via OROMUCOSAL

## 2021-02-02 MED ORDER — CHLORHEXIDINE GLUCONATE 0.12% ORAL RINSE (MEDLINE KIT)
15.0000 mL | Freq: Two times a day (BID) | OROMUCOSAL | Status: DC
  Administered 2021-02-02 – 2021-02-03 (×2): 15 mL via OROMUCOSAL

## 2021-02-02 MED ORDER — MIDAZOLAM HCL 2 MG/2ML IJ SOLN: 1.0000 mg | INTRAMUSCULAR | Status: DC | PRN

## 2021-02-02 MED ORDER — ADULT MULTIVITAMIN LIQUID CH
15.0000 mL | Freq: Every day | ORAL | Status: DC
  Administered 2021-02-02: 15 mL
  Filled 2021-02-02: qty 15

## 2021-02-02 MED ORDER — IPRATROPIUM-ALBUTEROL 0.5-2.5 (3) MG/3ML IN SOLN
3.0000 mL | Freq: Four times a day (QID) | RESPIRATORY_TRACT | Status: DC
  Administered 2021-02-02 – 2021-02-03 (×3): 3 mL via RESPIRATORY_TRACT
  Filled 2021-02-02 (×3): qty 3

## 2021-02-02 MED ORDER — DEXMEDETOMIDINE HCL IN NACL 400 MCG/100ML IV SOLN
0.0000 ug/kg/h | INTRAVENOUS | Status: DC
  Administered 2021-02-02: 0.4 ug/kg/h via INTRAVENOUS
  Filled 2021-02-02: qty 100

## 2021-02-02 MED ORDER — FUROSEMIDE 10 MG/ML IJ SOLN
40.0000 mg | Freq: Once | INTRAMUSCULAR | Status: AC
  Administered 2021-02-02: 40 mg via INTRAVENOUS
  Filled 2021-02-02: qty 4

## 2021-02-02 NOTE — Progress Notes (Addendum)
NAME:  Steven Chapman, MRN:  [id number], DOB:  1941-04-27, LOS: 1 ADMISSION DATE:  01/18/2021, CONSULTATION DATE:  01/18/2021 REFERRING MD: Georgette Dover, CHIEF COMPLAINT:  Cardiac arrest     History of Present Illness:  Steven Chapman is a 80 y.o. male presented initially with concern for new onset CHF at [facility] and suffered brief cardiac arrest thought to be respiratory driven.   Pertinent  Medical History  Anxiety HTN Prior SBO s/p colectomy CHF Prior cancer  Significant Hospital Events: Including procedures, antibiotic start and stop dates in addition to other pertinent events    5/23 transferred from [facility] with complaints concern for new onset CHF. Suffered 10 min PEA cardiac arrest at [facility]. Patient did not require intubation as he rapidly awoke and was able to protect his airway initially, intubated prior to transfer   Interim History / Subjective:  No acute events overnight   Objective   Blood pressure (!) 130/57, pulse (!) 47, temperature 98.42 F (36.9 C), resp. rate 18, height 5\' 8"  (1.727 m), weight 94.9 kg, SpO2 97 %. CVP:  [4 mmHg-21 mmHg] 11 mmHg  Vent Mode: PRVC FiO2 (%):  [60 %-100 %] 60 % Set Rate:  [18 bmp] 18 bmp Vt Set:  [550 mL] 550 mL PEEP:  [5 cmH20] 5 cmH20 Plateau Pressure:  [12 cmH20-27 cmH20] 20 cmH20   Intake/Output Summary (Last 24 hours) at 02/02/2021 5631 Last data filed at 02/02/2021 0700 Gross per 24 hour  Intake 761.32 ml  Output 1540 ml  Net -778.68 ml   Filed Weights   01/25/2021 1200 02/02/21 0400  Weight: 97.5 kg 94.9 kg    Examination: General: Acute on chronic ill appearing elderly male lying in bed on mechanical ventilation, in NAD HEENT: ETT, MM pink/moist, PERRL,  Neuro: Sedated on vent gets agitated when sedation lightened but unable to follow commands  CV: s1s2 regular rate and rhythm, no murmur, rubs, or gallops,  PULM: Diminished air entry bilaterally, no increased work of breathing, tolerating vent with sedation  GI: soft, bowel  sounds active in all 4 quadrants, non-tender, non-distended Extremities: warm/dry, 1+ pitting edema  Skin: no rashes or lesions   Labs/imaging that I have personally reviewed    CXR at OSH with cardiac enlargement and vascular congestions   Head CT at OSH negative   Resolved Hospital Problem list     Assessment & Plan:  Cardiac arrest - Concern for volume overload leading to respiratory distress as arrest cause  - Patient was awake and follow commands post arrest but later developed come AMS prompting BIPAP therapy and later intubation prior to transfer  Concern for underlying CHF - Patient was seen at [facility] 5/11 for complaints of increased lower extremity edema with associated dyspnea. Patient refused admission at that time and was discharged form the ED P: ECHO pending  Continuous telemetry  Strict intake and output  Continue to diurese as tolerated  Trend CVP currently 9 Monitor volume status  Daily weight  Vent support as below   Acute respiratory insufficiency -In the setting cardiac arrest and high suspension for pulmonary edema   P: Continue ventilator support with lung protective strategies  Wean sedation, if mentation allows trial SBT Wean PEEP and FiO2 for sats greater than 90%. Head of bed elevated 30 degrees. Plateau pressures less than 30 cm H20.  Follow intermittent chest x-ray and ABG.   Ensure adequate pulmonary hygiene  Follow cultures  VAP bundle in place  PAD protocol  Acute metabolic  encephalopathy  -Patient was seen alert and oriented post arrest P: Lighten sedation today to evaluate mentation Maintain neuro protective measures; goal for eurothermia, euglycemia, eunatermia, normoxia, and PCO2 goal of 35-40 Nutrition and bowel regiment  Seizure precautions  Aspirations precautions   Hx of HTN  -Per care everywhere homemedications include labetalol, hydralazine, lasix, Benicar,   P: BP remains well controlled  Home meds remain on hold   Resume home meds when appropriate   Hx of bowel obstruction s/p colectomy with abdominal distension on admisison  At risk for malnutrition  P: KUB without acute abnormality   Prolonged QTc -561 on EKG 5/24 P: Avoid QTc prolonging meds  Repeat EKG as needed   Daily alcohol use  -Family reports long standing history of alcohol abuse with prior extensive use but over the last few years he has decreased the amount. He currently consumes one beer per day  P: CIWA Supportive care  High risk for withdrawal  Supplement folate, multivitamin, and thiamine   Best practice   Diet:  NPO Pain/Anxiety/Delirium protocol (if indicated): Yes (RASS goal -1) VAP protocol (if indicated): Yes DVT prophylaxis: Subcutaneous Heparin GI prophylaxis: PPI Glucose control:  SSI Yes Central venous access:  Yes, and it is still needed Arterial line:  N/A Foley:  Yes, and it is still needed Mobility:  bed rest  PT consulted: N/A Last date of multidisciplinary goals of care discussion Continue to update family daily  Code Status:  full code  Disposition: ICU    Critical care time:    Performed by: Johnsie Cancel  Total critical care time: 35 minutes  Critical care time was exclusive of separately billable procedures and treating other patients.  Critical care was necessary to treat or prevent imminent or life-threatening deterioration.  Critical care was time spent personally by me on the following activities: development of treatment plan with patient and/or surrogate as well as nursing, discussions with consultants, evaluation of patient's response to treatment, examination of patient, obtaining history from patient or surrogate, ordering and performing treatments and interventions, ordering and review of laboratory studies, ordering and review of radiographic studies, pulse oximetry and re-evaluation of patient's condition.  Johnsie Cancel, NP-C [facility] Personal  contact information can be found on Amion  02/02/2021, 7:07 AM   Pulmonary critical care attending:  This is a 80 year old gentleman, past medical history hypertension, history of SBO, congestive heart failure, PEA cardiac arrest x10 minutes.  Bilateral infiltrates concerning for bilateral pulmonary edema acute hypoxic respiratory failure requiring intubation mechanical ventilation.  BP (!) 173/71 (BP Location: Left Arm)   Pulse (!) 58   Temp 98.78 F (37.1 C)   Resp (!) 25   Ht 5\' 8"  (1.727 m)   Wt 94.9 kg   SpO2 97%   BMI 31.81 kg/m   General: Elderly male, intubated mechanical life support HEENT: Endotracheal tube in place Heart: Regular rhythm, S1-S2 Lungs: Bilateral mechanically ventilated breath sounds Abdomen soft nontender nondistended  Labs:  Sodium 140 Potassium 3.6 Creatinine 1.35 White blood cell count 10.3 Hemoglobin 10.2  Assessment: Acute hypoxemic respiratory failure on mechanical ventilation Acute metabolic encephalopathy secondary above Possible congestive heart failure, bilateral pulmonary edema Hypertension Bilateral infiltrates, possible pneumonia, thick secretions History of bowel obstruction, status post colectomy  Plan: Remains on full mechanical vent support PAD sedation guidelines Continue propofol Attempt to come off propofol and switch to Precedex if we can. Continue to hold home BP meds Medical reconciliation today completed by  pharmacy. Echocardiogram pending Continue Lasix 40 IV twice daily Follow urine output and kidney function.  This patient is critically ill with multiple organ system failure; which, requires frequent high complexity decision making, assessment, support, evaluation, and titration of therapies. This was completed through the application of advanced monitoring technologies and extensive interpretation of multiple databases. During this encounter critical care time was devoted to patient care services described in this  note for 32 minutes.   Garner Nash, DO [facility] 02/02/2021 11:25 AM

## 2021-02-02 NOTE — Progress Notes (Signed)
Rt entered to find pt BS very diminished, appearance of possible tube feed in suction from ETT and mouth.  RT attempted lavaging pt to remove some secretions that were very thick and tenacious.  RN entered while RT went to get saline and Pt's sats dropped to 16s.  RT continued to attempt suctioning and turned FiO2 to 100%.  Pt sat returned to 94-96%, but pt appears more agitated than before.  Pt diaphoretic as well.  RN ordered chest xray and has been completed at this time.  Peak pressures continue to alarm at this time.  RT awaiting further instruction.

## 2021-02-02 NOTE — Progress Notes (Signed)
Initial Nutrition Assessment  DOCUMENTATION CODES:   Not applicable  INTERVENTION:   Supplement thiamine and folic acid given ETOH use  Discontinue liquid MVI given high osmolality   Tube feeding: -Vital AF 1.2 @ 20 ml/hr via OG -Increase by 10 ml Q4 hours to goal rate of 60 ml/hr (1440 ml) -ProSource 45 ml BID  Provides: 1808 kcals, 130 grams protein, 1168 ml free water.   NUTRITION DIAGNOSIS:   Increased nutrient needs related to acute illness as evidenced by estimated needs.  GOAL:   Patient will meet greater than or equal to 90% of their needs  MONITOR:   Vent status,Skin,TF tolerance,Weight trends,Labs,I & O's  REASON FOR ASSESSMENT:   Consult,Ventilator Enteral/tube feeding initiation and management  ASSESSMENT:   Patient with PMH significant for HTN, prostate cancer, prior SBO s/p colectomy, ETOH use, and CHF. Presents this admission with cardiac arrest.   No pressors. Weaning propofol. No BM since admit, regimen started. Noted ETOH use, supplement folic acid and thiamine. Okay to start feeding per CCM. Xray confirms OG located in the stomach. Titrate to goal.   Patient is currently intubated on ventilator support MV: 9.1 L/min Temp (24hrs), Avg:98.9 F (37.2 C), Min:98.42 F (36.9 C), Max:100.04 F (37.8 C)  UOP: 1540 ml x 24 hrs   Drips: propofol (2.9 ml/hr- weaning) Medications: colace, folic acid, 40 mg lasix BID, miralax, miralax, thiamine  Labs: Cr 1.35- trending down   NUTRITION - FOCUSED PHYSICAL EXAM:  Flowsheet Row Most Recent Value  Orbital Region No depletion  Upper Arm Region No depletion  Thoracic and Lumbar Region Unable to assess  Buccal Region Unable to assess  Temple Region No depletion  Clavicle Bone Region No depletion  Clavicle and Acromion Bone Region No depletion  Scapular Bone Region Unable to assess  Dorsal Hand No depletion  Patellar Region No depletion  Anterior Thigh Region No depletion  Posterior Calf Region No  depletion  Edema (RD Assessment) Mild  Hair Reviewed  Eyes Unable to assess  Mouth Unable to assess  Skin Reviewed  Nails Reviewed     Diet Order:   Diet Order            Diet NPO time specified  Diet effective now                 EDUCATION NEEDS:   Not appropriate for education at this time  Skin:  Skin Assessment: Reviewed RN Assessment  Last BM:  PTA  Height:   Ht Readings from Last 1 Encounters:  02/08/2021 5\' 8"  (1.727 m)    Weight:   Wt Readings from Last 1 Encounters:  02/02/21 94.9 kg    BMI:  Body mass index is 31.81 kg/m.  Estimated Nutritional Needs:   Kcal:  1808 kcal  Protein:  120-145 grams  Fluid:  >/= 1.8 L/day  Mariana Single RD, LDN Clinical Nutrition Pager listed in [place]

## 2021-02-02 NOTE — Progress Notes (Signed)
[facility] Progress Note Patient Name: KENO CARAWAY DOB: December 21, 1940 MRN: [id number]   Date of Service  02/02/2021  HPI/Events of Note  Episode of hypoxia on 100%/PRVC 18/TV 550/P 5. Review of CXR reveals  1. Worsening hazy opacity at the right lung base likely increasing effusion and atelectasis. 2. Unchanged dense left lung base consolidation and effusion. 3. Increasing perivascular haziness likely pulmonary edema. 4. Support apparatus unchanged. CVP = 29-30 per nursing. However, patient is coughing which might skew the reading higher. CVP noted to be in teens when not coughing.   eICU Interventions  Plan: 1. Lasix 40 mg IV X 1 now.  2. Increase PEEP to 10.     Intervention Category Major Interventions: Hypoxemia - evaluation and management  Lysle Dingwall 02/02/2021, 11:48 PM

## 2021-02-02 NOTE — Plan of Care (Signed)

## 2021-02-02 NOTE — Progress Notes (Incomplete)
  Echocardiogram 2D Echocardiogram has been performed.  Wil Slape  Lynnette Caffey 02/02/2021, 8:30 AM

## 2021-02-02 NOTE — Progress Notes (Signed)
Change made per order

## 2021-02-03 DIAGNOSIS — Z66 Do not resuscitate: Secondary | ICD-10-CM | POA: Diagnosis not present

## 2021-02-03 DIAGNOSIS — I469 Cardiac arrest, cause unspecified: Secondary | ICD-10-CM | POA: Diagnosis not present

## 2021-02-03 LAB — CBC
HCT: 30 % — ABNORMAL LOW (ref 39.0–52.0)
Hemoglobin: 9.6 g/dL — ABNORMAL LOW (ref 13.0–17.0)
MCH: 31.6 pg (ref 26.0–34.0)
MCHC: 32 g/dL (ref 30.0–36.0)
MCV: 98.7 fL (ref 80.0–100.0)
Platelets: 129 10*3/uL — ABNORMAL LOW (ref 150–400)
RBC: 3.04 MIL/uL — ABNORMAL LOW (ref 4.22–5.81)
RDW: 14.8 % (ref 11.5–15.5)
WBC: 10.9 10*3/uL — ABNORMAL HIGH (ref 4.0–10.5)
nRBC: 0 % (ref 0.0–0.2)

## 2021-02-03 LAB — BLOOD CULTURE ID PANEL (REFLEXED) - BCID2

## 2021-02-03 LAB — GLUCOSE, CAPILLARY
Glucose-Capillary: 109 mg/dL — ABNORMAL HIGH (ref 70–99)
Glucose-Capillary: 122 mg/dL — ABNORMAL HIGH (ref 70–99)
Glucose-Capillary: 139 mg/dL — ABNORMAL HIGH (ref 70–99)

## 2021-02-03 LAB — COMPREHENSIVE METABOLIC PANEL
ALT: 20 U/L (ref 0–44)
AST: 12 U/L — ABNORMAL LOW (ref 15–41)
Albumin: 2.6 g/dL — ABNORMAL LOW (ref 3.5–5.0)
Alkaline Phosphatase: 42 U/L (ref 38–126)
Anion gap: 9 (ref 5–15)
BUN: 33 mg/dL — ABNORMAL HIGH (ref 8–23)
CO2: 26 mmol/L (ref 22–32)
Calcium: 8 mg/dL — ABNORMAL LOW (ref 8.9–10.3)
Chloride: 105 mmol/L (ref 98–111)
Creatinine, Ser: 1.76 mg/dL — ABNORMAL HIGH (ref 0.61–1.24)
GFR, Estimated: 39 mL/min — ABNORMAL LOW (ref >60)
Glucose, Bld: 136 mg/dL — ABNORMAL HIGH (ref 70–99)
Potassium: 3.9 mmol/L (ref 3.5–5.1)
Sodium: 140 mmol/L (ref 135–145)
Total Bilirubin: 0.8 mg/dL (ref 0.3–1.2)
Total Protein: 5 g/dL — ABNORMAL LOW (ref 6.5–8.1)

## 2021-02-03 LAB — TRIGLYCERIDES: Triglycerides: 59 mg/dL (ref <150)

## 2021-02-03 LAB — URINE CULTURE: Culture: NO GROWTH

## 2021-02-03 LAB — PHOSPHORUS: Phosphorus: 4.5 mg/dL (ref 2.5–4.6)

## 2021-02-03 LAB — MAGNESIUM: Magnesium: 2.3 mg/dL (ref 1.7–2.4)

## 2021-02-03 MED ORDER — SCOPOLAMINE 1 MG/3DAYS TD PT72
1.0000 | MEDICATED_PATCH | TRANSDERMAL | Status: DC
  Administered 2021-02-03: 1.5 mg via TRANSDERMAL
  Filled 2021-02-03: qty 1

## 2021-02-03 MED ORDER — ACETAMINOPHEN 650 MG RE SUPP: 650.0000 mg | Freq: Four times a day (QID) | RECTAL | Status: DC | PRN

## 2021-02-03 MED ORDER — POLYVINYL ALCOHOL 1.4 % OP SOLN
1.0000 [drp] | Freq: Four times a day (QID) | OPHTHALMIC | Status: DC | PRN
  Filled 2021-02-03: qty 15

## 2021-02-03 MED ORDER — IPRATROPIUM-ALBUTEROL 0.5-2.5 (3) MG/3ML IN SOLN: 3.0000 mL | Freq: Four times a day (QID) | RESPIRATORY_TRACT | Status: DC | PRN

## 2021-02-03 MED ORDER — GLYCOPYRROLATE 1 MG PO TABS
1.0000 mg | ORAL_TABLET | ORAL | Status: DC | PRN
  Filled 2021-02-03: qty 1

## 2021-02-03 MED ORDER — GLYCOPYRROLATE 0.2 MG/ML IJ SOLN
0.2000 mg | INTRAMUSCULAR | Status: DC | PRN
  Administered 2021-02-03: 0.2 mg via INTRAVENOUS
  Filled 2021-02-03: qty 1

## 2021-02-03 MED ORDER — MIDAZOLAM HCL 2 MG/2ML IJ SOLN
2.0000 mg | INTRAMUSCULAR | Status: DC | PRN
  Administered 2021-02-03 (×2): 2 mg via INTRAVENOUS
  Filled 2021-02-03: qty 4

## 2021-02-03 MED ORDER — DIPHENHYDRAMINE HCL 50 MG/ML IJ SOLN: 25.0000 mg | INTRAMUSCULAR | Status: DC | PRN

## 2021-02-03 MED ORDER — ATROPINE SULFATE 1 % OP SOLN
2.0000 [drp] | Freq: Four times a day (QID) | OPHTHALMIC | Status: DC | PRN
  Administered 2021-02-03: 2 [drp] via SUBLINGUAL
  Filled 2021-02-03 (×2): qty 2

## 2021-02-03 MED ORDER — GLYCOPYRROLATE 0.2 MG/ML IJ SOLN
0.2000 mg | INTRAMUSCULAR | Status: DC | PRN
  Filled 2021-02-03: qty 1

## 2021-02-03 MED ORDER — ACETAMINOPHEN 325 MG PO TABS: 650.0000 mg | ORAL_TABLET | Freq: Four times a day (QID) | ORAL | Status: DC | PRN

## 2021-02-04 DIAGNOSIS — Z66 Do not resuscitate: Secondary | ICD-10-CM | POA: Diagnosis present

## 2021-02-04 DIAGNOSIS — J9601 Acute respiratory failure with hypoxia: Secondary | ICD-10-CM | POA: Diagnosis present

## 2021-02-04 DIAGNOSIS — J189 Pneumonia, unspecified organism: Secondary | ICD-10-CM | POA: Diagnosis present

## 2021-02-04 DIAGNOSIS — I5032 Chronic diastolic (congestive) heart failure: Secondary | ICD-10-CM | POA: Diagnosis present

## 2021-02-04 DIAGNOSIS — Z9911 Dependence on respirator [ventilator] status: Secondary | ICD-10-CM

## 2021-02-05 LAB — CULTURE, RESPIRATORY W GRAM STAIN: Gram Stain: NONE SEEN

## 2021-02-05 LAB — CULTURE, BLOOD (ROUTINE X 2)

## 2021-02-06 LAB — CULTURE, BLOOD (ROUTINE X 2): Culture: NO GROWTH

## 2021-02-10 NOTE — Progress Notes (Signed)
PHARMACY - PHYSICIAN COMMUNICATION CRITICAL VALUE ALERT - BLOOD CULTURE IDENTIFICATION (BCID)  Steven Chapman is an 80 y.o. male who presented to [facility] on 01/11/2021 with a chief complaint of new onset CHF.  Assessment:  1/3 blood culture bottles growing staph epidermidis (no resistance). Could be contaminant.  Name of physician (or Provider) Contacted: Dr. Oletta Darter  Current antibiotics: Rocephin 2gm IV q24h  Changes to prescribed antibiotics recommended:  No additional abx warranted at this time  Results for orders placed or performed during the hospital encounter of 01/13/2021  Blood Culture ID Panel (Reflexed) (Collected: 01/15/2021  6:19 PM)  Result Value Ref Range   Enterococcus faecalis NOT DETECTED NOT DETECTED   Enterococcus Faecium NOT DETECTED NOT DETECTED   Listeria monocytogenes NOT DETECTED NOT DETECTED   Staphylococcus species DETECTED (A) NOT DETECTED   Staphylococcus aureus (BCID) NOT DETECTED NOT DETECTED   Staphylococcus epidermidis DETECTED (A) NOT DETECTED   Staphylococcus lugdunensis NOT DETECTED NOT DETECTED   Streptococcus species NOT DETECTED NOT DETECTED   Streptococcus agalactiae NOT DETECTED NOT DETECTED   Streptococcus pneumoniae NOT DETECTED NOT DETECTED   Streptococcus pyogenes NOT DETECTED NOT DETECTED   A.calcoaceticus-baumannii NOT DETECTED NOT DETECTED   Bacteroides fragilis NOT DETECTED NOT DETECTED   Enterobacterales NOT DETECTED NOT DETECTED   Enterobacter cloacae complex NOT DETECTED NOT DETECTED   Escherichia coli NOT DETECTED NOT DETECTED   Klebsiella aerogenes NOT DETECTED NOT DETECTED   Klebsiella oxytoca NOT DETECTED NOT DETECTED   Klebsiella pneumoniae NOT DETECTED NOT DETECTED   Proteus species NOT DETECTED NOT DETECTED   Salmonella species NOT DETECTED NOT DETECTED   Serratia marcescens NOT DETECTED NOT DETECTED   Haemophilus influenzae NOT DETECTED NOT DETECTED   Neisseria meningitidis NOT DETECTED NOT DETECTED   Pseudomonas  aeruginosa NOT DETECTED NOT DETECTED   Stenotrophomonas maltophilia NOT DETECTED NOT DETECTED   Candida albicans NOT DETECTED NOT DETECTED   Candida auris NOT DETECTED NOT DETECTED   Candida glabrata NOT DETECTED NOT DETECTED   Candida krusei NOT DETECTED NOT DETECTED   Candida parapsilosis NOT DETECTED NOT DETECTED   Candida tropicalis NOT DETECTED NOT DETECTED   Cryptococcus neoformans/gattii NOT DETECTED NOT DETECTED   Methicillin resistance mecA/C NOT DETECTED NOT DETECTED    Sherlon Handing, PharmD, BCPS Please see amion for complete clinical pharmacist phone list 24-Feb-2021  5:55 AM

## 2021-02-10 NOTE — Plan of Care (Signed)
  Interdisciplinary Goals of Care Family Meeting   Date carried out: 02-07-21  Location of the meeting: Bedside  Member's involved: Merlene Laughter - nurse Practitioner, Myriam Jacobson - Bedside Registered Nurse and Family Member or next of kin including patient's spouse, 2 daughters, and son  Durable Power of Attorney or Loss adjuster, chartered: Shared amongst family members  Discussion: Continued goals of care discussion held at bedside with all family members present. Spouse and all children agree that patient would not want these aggressive measures including mechanical ventilation therefore collective decision was made to perform one-way extubation and transition to comfort measures. We will continue fentanyl infusion but discontinue propofol prior to extubation.   Code status: Full DNR  Disposition: In-patient comfort care  Time spent for the meeting: [place], NP-C [facility] Personal contact information can be found on Amion  2021/02/07, 11:16 AM

## 2021-02-10 NOTE — Progress Notes (Signed)
Patient extubated to comfort care per order and family request. Family at bedside.

## 2021-02-10 NOTE — Death Summary Note (Signed)
DEATH SUMMARY   Patient Details  Name: Steven Chapman MRN: [id number] DOB: 1941-04-13  Admission/Discharge Information   Admit Date:  02-15-21  Date of Death: Date of Death: 2021/02/17  Time of Death: Time of Death: 1450  Length of Stay: 2  Referring Physician: Neale Burly, MD   Reason(s) for Hospitalization  Steven Chapman is a 80 y.o. male presented initially with concern for new onset CHF at [facility] and suffered brief cardiac arrest thought to be respiratory driven.   Diagnoses  Preliminary cause of death: Cardiac arrest [facility]) Secondary Diagnoses (including complications and co-morbidities):  Active Problems:   Cardiac arrest ([place])   Pneumonia   Chronic diastolic heart failure (HCC)   On mechanically assisted ventilation ([place])   Acute hypoxemic respiratory failure ([place])   DNR (do not resuscitate)   Brief Hospital Course (including significant findings, care, treatment, and services provided and events leading to death)  Steven Chapman is a 80 y.o. year old male who PMH significant for anxiety, HTN, prior SBO s/p colectomy, CHF, and prior cancer who presented to [facility] initially with complaints of increased shortness of breath and lower extremity edema that began several days prior to admission. History obtained per chart review as patient is currently intubated an sedated. While in the ED at [facility] patient lost consciousness where he was seen in PEA cardiac arrest. He received 10 minuets of CPR (unsure if he received any ACLS medications) before ROSC was achieved. Patient did not require intubation as he rapidly awoke and was able to protect his airway initially. During ED stay it appears that patient again developed respiratory distress and required BIPAP therapy. Given acute illness patient was accepted in transfer to [facility] health for further care. Patient was intubated for airway protection prior to transfer.   On arrival he is seen sedated on both propofol and versed drip. He is  hemodynamically stable and mildly bradycardiac. Repeat labs and imagine currently pending   Cardiac arrest - Concern for volume overload leading to respiratory distress as arrest cause  - Patient was awake and follow commands post arrest but later developed come AMS prompting BIPAP therapy and later intubation prior to transfer  Concern for underlying CHF - Patient was seen at [facility] 5/11 for complaints of increased lower extremity edema with associated dyspnea. Patient refused admission at that time and was discharged form the ED  Acute respiratory insufficiency -In the setting cardiac arrest and high suspension for pulmonary edema   Concern for evolving CAP -5/24 patient was seen with worsening rhonchi and thick tan secretions Acute metabolic encephalopathy  Hx of HTN  -Per care everywhere homemedications include labetalol, hydralazine, lasix, Benicar,   Hx of bowel obstruction s/p colectomy with abdominal distension on admisison  At risk for malnutrition  Prolonged QTc Daily alcohol use  -Family reports long standing history of alcohol abuse with prior extensive use but over the last few years he has decreased the amount. He currently consumes one beer per day   Goals of care Patient's family including 2 daughters have reached out to clinical staff to discuss goals of care.  Per family patient has indicated no desire for aggressive measures eluding intubation.  Daughter states patient relayed this information just last week.  Family is currently weighing options and will relay to clinical staff desires for continued aggressive care.  Ultimately the family decided that the patient would not want to be on mechanical support and requested removal and transition to comfort care. All  family gathered at bedside. Patient was liberated and passed peacefully in the icu.   Pertinent Labs and Studies  Significant Diagnostic Studies DG Chest 1 View  Result Date: 02/02/2021 CLINICAL DATA:   Respiratory distress. EXAM: CHEST  1 VIEW COMPARISON:  Earlier this day. FINDINGS: Endotracheal tube tip is 2.7 cm from the carina. Enteric tube tip below the diaphragm not included in the field of view. Right internal jugular central line tip projects over the jugular vein. Worsening hazy opacity at the right lung base likely increasing effusion and atelectasis. Dense left lung base consolidation is unchanged. Stable heart size. Increasing perivascular haziness. No pneumothorax. IMPRESSION: 1. Worsening hazy opacity at the right lung base likely increasing effusion and atelectasis. 2. Unchanged dense left lung base consolidation and effusion. 3. Increasing perivascular haziness likely pulmonary edema. 4. Support apparatus unchanged. Electronically Signed   By: Keith Rake M.D.   On: 02/02/2021 23:35   DG Abd 1 View  Result Date: 01/14/2021 CLINICAL DATA:  Orogastric tube placement. EXAM: ABDOMEN - 1 VIEW COMPARISON:  None. FINDINGS: Tip and side port of the enteric tube below the diaphragm in the stomach. Normal bowel gas pattern in the included abdomen. No bowel dilatation. Stool in the right colon. IMPRESSION: Tip and side port of the enteric tube below the diaphragm in the stomach. Electronically Signed   By: Keith Rake M.D.   On: 01/24/2021 15:19   DG CHEST PORT 1 VIEW  Result Date: 02/02/2021 CLINICAL DATA:  Respiratory distress. EXAM: PORTABLE CHEST 1 VIEW COMPARISON:  Chest x-ray 01/31/2021. FINDINGS: Endotracheal tube, NG tube, right IJ line stable position. Cardiomegaly with normal pulmonary vascularity. Improved aeration of both lungs with clearing interstitial prominence findings consistent with clearing CHF. Left lower lobe atelectasis. Persistent left-sided pleural effusion. No pneumothorax. IMPRESSION: 1.  Lines and tubes in stable position. 2.  Cardiomegaly.  No pulmonary venous congestion. 3. Interim partial clearing of bilateral interstitial prominence most consistent with  clearing CHF. 4. Left lower lobe atelectasis. Persistent left-sided pleural effusion. Electronically Signed   By: Marcello Moores  Register   On: 02/02/2021 13:49   DG Chest Port 1 View  Result Date: 01/31/2021 CLINICAL DATA:  Status post cardiac arrest. EXAM: PORTABLE CHEST 1 VIEW COMPARISON:  Chest radiograph Jan 31, 2021. FINDINGS: Endotracheal tube tip approximately 2.8 cm above the carina. Gastric tube courses below the diaphragm outside the field of few. Right IJ catheter with tip projecting at the right clavicle medially. Enlarged cardiac silhouette. Calcific atherosclerosis of the aorta. Diffuse interstitial parotid with hazy opacities in the right lung and dense opacity at the left lung base. Suspected layering right pleural effusion, slightly increased. No definite pneumothorax on this limited AP semi erect radiograph. Suspected mildly displaced lower right posterolateral rib fracture. The cardiomediastinal silhouette is accentuated by low lung volumes and AP technique. IMPRESSION: 1. Endotracheal tube tip is now 2.8 cm from the carina. Otherwise, stable support devices. 2. Similar cardiomegaly with suspected pulmonary edema. Increased suspected layering right pleural effusion. 3. New dense left basilar opacity, which could represent atelectasis, aspiration, and/or pneumonia. Left pleural effusion is not excluded. 4. Suspected mildly displaced lower right posterolateral rib fracture. Electronically Signed   By: Margaretha Sheffield MD   On: 02/07/2021 14:09   ECHOCARDIOGRAM COMPLETE  Result Date: 02/02/2021    ECHOCARDIOGRAM REPORT   Patient Name:   DAEVON HOLDREN Date of Exam: 02/02/2021 Medical Rec #:  [id number]      Height:       68.0  in Accession #:    [id number]     Weight:       209.2 lb Date of Birth:  01-Jun-1941     BSA:          2.083 m Patient Age:    85 years       BP:           130/57 mmHg Patient Gender: M              HR:           47 bpm. Exam Location:  Inpatient Procedure: 2D Echo, Cardiac  Doppler and Color Doppler Indications:    Cardiac arrest  History:        Patient has no prior history of Echocardiogram examinations.                 CHF; Risk Factors:Hypertension.  Sonographer:    Cammy Brochure Referring Phys: [phone] [place]  1. Left ventricular ejection fraction, by estimation, is 60 to 65%. The left ventricle has normal function. The left ventricle has no regional wall motion abnormalities. There is mild left ventricular hypertrophy. Left ventricular diastolic parameters are consistent with Grade I diastolic dysfunction (impaired relaxation).  2. Right ventricular systolic function is normal. The right ventricular size is mildly enlarged.  3. Left atrial size was moderately dilated.  4. Right atrial size was mildly dilated.  5. The pericardial effusion is posterior to the left ventricle.  6. The mitral valve is normal in structure. No evidence of mitral valve regurgitation. No evidence of mitral stenosis.  7. The aortic valve is normal in structure. There is mild calcification of the aortic valve. There is mild thickening of the aortic valve. Aortic valve regurgitation is not visualized. Mild to moderate aortic valve sclerosis/calcification is present, without any evidence of aortic stenosis.  8. Aortic dilatation noted. There is mild dilatation of the ascending aorta, measuring 41 mm.  9. The inferior vena cava is normal in size with <50% respiratory variability, suggesting right atrial pressure of 8 mmHg. FINDINGS  Left Ventricle: Left ventricular ejection fraction, by estimation, is 60 to 65%. The left ventricle has normal function. The left ventricle has no regional wall motion abnormalities. The left ventricular internal cavity size was normal in size. There is  mild left ventricular hypertrophy. Left ventricular diastolic parameters are consistent with Grade I diastolic dysfunction (impaired relaxation). Right Ventricle: The right ventricular size is mildly  enlarged. No increase in right ventricular wall thickness. Right ventricular systolic function is normal. Left Atrium: Left atrial size was moderately dilated. Right Atrium: Right atrial size was mildly dilated. Pericardium: Trivial pericardial effusion is present. The pericardial effusion is posterior to the left ventricle. Mitral Valve: The mitral valve is normal in structure. No evidence of mitral valve regurgitation. No evidence of mitral valve stenosis. Tricuspid Valve: The tricuspid valve is normal in structure. Tricuspid valve regurgitation is mild . No evidence of tricuspid stenosis. Aortic Valve: The aortic valve is normal in structure. There is mild calcification of the aortic valve. There is mild thickening of the aortic valve. Aortic valve regurgitation is not visualized. Mild to moderate aortic valve sclerosis/calcification is present, without any evidence of aortic stenosis. Aortic valve mean gradient measures 8.0 mmHg. Aortic valve peak gradient measures 15.7 mmHg. Aortic valve area, by VTI measures 2.18 cm. Pulmonic Valve: The pulmonic valve was normal in structure. Pulmonic valve regurgitation is mild. No evidence of pulmonic stenosis. Aorta: Aortic dilatation noted.  There is mild dilatation of the ascending aorta, measuring 41 mm. Venous: The inferior vena cava is normal in size with less than 50% respiratory variability, suggesting right atrial pressure of 8 mmHg. IAS/Shunts: No atrial level shunt detected by color flow Doppler.  LEFT VENTRICLE PLAX 2D LVIDd:         5.20 cm  Diastology LVIDs:         3.50 cm  LV e' medial:    4.90 cm/s LV PW:         1.60 cm  LV E/e' medial:  16.9 LV IVS:        1.20 cm  LV e' lateral:   8.70 cm/s LVOT diam:     2.10 cm  LV E/e' lateral: 9.5 LV SV:         98 LV SV Index:   47 LVOT Area:     3.46 cm  RIGHT VENTRICLE             IVC RV Basal diam:  4.60 cm     IVC diam: 1.80 cm RV S prime:     15.70 cm/s TAPSE (M-mode): 2.7 cm LEFT ATRIUM              Index        RIGHT ATRIUM           Index LA diam:        4.80 cm  2.30 cm/m  RA Area:     24.10 cm LA Vol (A2C):   104.0 ml 49.92 ml/m RA Volume:   81.10 ml  38.93 ml/m LA Vol (A4C):   101.0 ml 48.48 ml/m LA Biplane Vol: 105.0 ml 50.40 ml/m  AORTIC VALVE                    PULMONIC VALVE AV Area (Vmax):    2.24 cm     PV Vmax:       1.51 m/s AV Area (Vmean):   2.01 cm     PV Peak grad:  9.1 mmHg AV Area (VTI):     2.18 cm AV Vmax:           198.00 cm/s AV Vmean:          138.000 cm/s AV VTI:            0.452 m AV Peak Grad:      15.7 mmHg AV Mean Grad:      8.0 mmHg LVOT Vmax:         128.00 cm/s LVOT Vmean:        79.900 cm/s LVOT VTI:          0.284 m LVOT/AV VTI ratio: 0.63  AORTA Ao Root diam: 3.50 cm Ao Asc diam:  4.10 cm MITRAL VALVE MV Area (PHT): 1.78 cm     SHUNTS MV Decel Time: 426 msec     Systemic VTI:  0.28 m MV E velocity: 82.60 cm/s   Systemic Diam: 2.10 cm MV A velocity: 111.00 cm/s MV E/A ratio:  0.74 Candee Furbish MD Electronically signed by Candee Furbish MD Signature Date/Time: 02/02/2021/11:08:26 AM    Final     Microbiology Recent Results (from the past 240 hour(s))  MRSA PCR Screening     Status: None   Collection Time: 01/28/2021 11:45 AM   Specimen: Nasal Mucosa; Nasopharyngeal  Result Value Ref Range Status   MRSA by PCR NEGATIVE NEGATIVE Final    Comment:  The GeneXpert MRSA Assay (FDA approved for NASAL specimens only), is one component of a comprehensive MRSA colonization surveillance program. It is not intended to diagnose MRSA infection nor to guide or monitor treatment for MRSA infections. Performed at [facility], [place] [address], [place], [place] [address]   Urine culture     Status: None   Collection Time: 01/19/2021  2:22 PM   Specimen: Urine, Random  Result Value Ref Range Status   Specimen Description URINE, RANDOM  Final   Special Requests NONE  Final   Culture   Final    NO GROWTH Performed at [facility], [place] [address] [place],  [place] [address]    Report Status February 28, 2021 FINAL  Final  Culture, blood (routine x 2)     Status: Abnormal (Preliminary result)   Collection Time: 02/04/2021  6:19 PM   Specimen: BLOOD  Result Value Ref Range Status   Specimen Description BLOOD LEFT ANTECUBITAL  Final   Special Requests   Final    BOTTLES DRAWN AEROBIC ONLY Blood Culture results may not be optimal due to an inadequate volume of blood received in culture bottles   Culture  Setup Time   Final    GRAM POSITIVE COCCI IN CLUSTERS AEROBIC BOTTLE ONLY CRITICAL RESULT CALLED TO, READ BACK BY AND VERIFIED WITH: PHARMD C. AMAND [phone] FCP    Culture (A)  Final    STAPHYLOCOCCUS EPIDERMIDIS THE SIGNIFICANCE OF ISOLATING THIS ORGANISM FROM A SINGLE SET OF BLOOD CULTURES WHEN MULTIPLE SETS ARE DRAWN IS UNCERTAIN. PLEASE NOTIFY THE MICROBIOLOGY DEPARTMENT WITHIN ONE WEEK IF SPECIATION AND SENSITIVITIES ARE REQUIRED. Performed at [facility], [place] [address] [place], [place] [address]    Report Status PENDING  Incomplete  Blood Culture ID Panel (Reflexed)     Status: Abnormal   Collection Time: 01/19/2021  6:19 PM  Result Value Ref Range Status   Enterococcus faecalis NOT DETECTED NOT DETECTED Final   Enterococcus Faecium NOT DETECTED NOT DETECTED Final   Listeria monocytogenes NOT DETECTED NOT DETECTED Final   Staphylococcus species DETECTED (A) NOT DETECTED Final    Comment: CRITICAL RESULT CALLED TO, READ BACK BY AND VERIFIED WITH: PHARMD C. AMAND [phone] FCP    Staphylococcus aureus (BCID) NOT DETECTED NOT DETECTED Final   Staphylococcus epidermidis DETECTED (A) NOT DETECTED Final    Comment: CRITICAL RESULT CALLED TO, READ BACK BY AND VERIFIED WITH: PHARMD C. AMAND [phone] FCP    Staphylococcus lugdunensis NOT DETECTED NOT DETECTED Final   Streptococcus species NOT DETECTED NOT DETECTED Final   Streptococcus agalactiae NOT DETECTED NOT DETECTED Final   Streptococcus pneumoniae NOT DETECTED NOT DETECTED Final    Streptococcus pyogenes NOT DETECTED NOT DETECTED Final   A.calcoaceticus-baumannii NOT DETECTED NOT DETECTED Final   Bacteroides fragilis NOT DETECTED NOT DETECTED Final   Enterobacterales NOT DETECTED NOT DETECTED Final   Enterobacter cloacae complex NOT DETECTED NOT DETECTED Final   Escherichia coli NOT DETECTED NOT DETECTED Final   Klebsiella aerogenes NOT DETECTED NOT DETECTED Final   Klebsiella oxytoca NOT DETECTED NOT DETECTED Final   Klebsiella pneumoniae NOT DETECTED NOT DETECTED Final   Proteus species NOT DETECTED NOT DETECTED Final   Salmonella species NOT DETECTED NOT DETECTED Final   Serratia marcescens NOT DETECTED NOT DETECTED Final   Haemophilus influenzae NOT DETECTED NOT DETECTED Final   Neisseria meningitidis NOT DETECTED NOT DETECTED Final   Pseudomonas aeruginosa NOT DETECTED NOT DETECTED Final  Stenotrophomonas maltophilia NOT DETECTED NOT DETECTED Final   Candida albicans NOT DETECTED NOT DETECTED Final   Candida auris NOT DETECTED NOT DETECTED Final   Candida glabrata NOT DETECTED NOT DETECTED Final   Candida krusei NOT DETECTED NOT DETECTED Final   Candida parapsilosis NOT DETECTED NOT DETECTED Final   Candida tropicalis NOT DETECTED NOT DETECTED Final   Cryptococcus neoformans/gattii NOT DETECTED NOT DETECTED Final   Methicillin resistance mecA/C NOT DETECTED NOT DETECTED Final    Comment: Performed at [facility], [place] [address], [place], [place] [address]  Culture, blood (routine x 2)     Status: None (Preliminary result)   Collection Time: 02/08/2021  8:50 PM   Specimen: BLOOD RIGHT ARM  Result Value Ref Range Status   Specimen Description BLOOD RIGHT ARM  Final   Special Requests   Final    BOTTLES DRAWN AEROBIC AND ANAEROBIC Blood Culture results may not be optimal due to an excessive volume of blood received in culture bottles   Culture   Final    NO GROWTH 3 DAYS Performed at [facility], [place] [address], [place], [place] [address]     Report Status PENDING  Incomplete  Culture, Respiratory w Gram Stain     Status: None (Preliminary result)   Collection Time: 02/02/21 12:31 PM   Specimen: Tracheal Aspirate; Respiratory  Result Value Ref Range Status   Specimen Description TRACHEAL ASPIRATE  Final   Special Requests NONE  Final   Gram Stain   Final    NO WBC SEEN ABUNDANT GRAM POSITIVE COCCI IN CLUSTERS    Culture   Final    ABUNDANT STAPHYLOCOCCUS AUREUS SUSCEPTIBILITIES TO FOLLOW Performed at [facility], [place] [address] [place], [place] [address]    Report Status PENDING  Incomplete    Lab Basic Metabolic Panel: Recent Labs  Lab 01/13/2021 1321 02/02/21 0353 02/02/21 0509 02/02/21 1231 02/02/21 1841 02/12/21 0406 02-12-2021 0744  NA 141 140 140  --   --   --  140  K 3.5 3.6 3.7  --   --   --  3.9  CL 104 106  --   --   --   --  105  CO2 29 27  --   --   --   --  26  GLUCOSE 107* 97  --   --   --   --  136*  BUN 19 18  --   --   --   --  33*  CREATININE 1.48* 1.35*  --   --   --   --  1.76*  CALCIUM 8.6* 8.4*  --   --   --   --  8.0*  MG 2.1 2.1  --  2.1 2.1 2.3  --   PHOS 4.1 3.5  --  3.5 3.2 4.5  --    Liver Function Tests: Recent Labs  Lab 01/23/2021 1321 12-Feb-2021 0744  AST 22 12*  ALT 34 20  ALKPHOS 50 42  BILITOT 0.9 0.8  PROT 5.4* 5.0*  ALBUMIN 3.1* 2.6*   No results for input(s): LIPASE, AMYLASE in the last 168 hours. No results for input(s): AMMONIA in the last 168 hours. CBC: Recent Labs  Lab 02/02/21 0353 02/02/21 0509 2021-02-12 0744  WBC 10.3  --  10.9*  HGB 10.1* 10.2* 9.6*  HCT 31.4* 30.0* 30.0*  MCV 97.5  --  98.7  PLT 139*  --  129*   Cardiac  Enzymes: No results for input(s): CKTOTAL, CKMB, CKMBINDEX, TROPONINI in the last 168 hours. Sepsis Labs: Recent Labs  Lab 01/10/2021 1321 02/07/2021 1529 02/02/21 0353 2021/03/02 0744  PROCALCITON 0.59  --   --   --   WBC  --   --  10.3 10.9*  LATICACIDVEN 1.0 2.4*  --   --     Procedures/Operations  ETT    Leory Plowman  L Leina Babe 02/04/2021, 10:34 AM

## 2021-02-10 NOTE — Progress Notes (Addendum)
NAME:  Steven Chapman, MRN:  [id number], DOB:  [birth year], LOS: 2 ADMISSION DATE:  02/02/2021, CONSULTATION DATE:  01/19/2021 REFERRING MD: Georgette Dover, CHIEF COMPLAINT:  Cardiac arrest     History of Present Illness:  Steven Chapman is a 80 y.o. male presented initially with concern for new onset CHF at [facility] and suffered brief cardiac arrest thought to be respiratory driven.   Pertinent  Medical History  Anxiety HTN Prior SBO s/p colectomy CHF Prior cancer  Significant Hospital Events: Including procedures, antibiotic start and stop dates in addition to other pertinent events    5/23 transferred from [facility] with complaints concern for new onset CHF. Suffered 10 min PEA cardiac arrest at [facility]. Patient did not require intubation as he rapidly awoke and was able to protect his airway initially, intubated prior to transfer   Interim History / Subjective:  Issues with hypoxia and agitation overnight Initially required increased FiO2 but now slowly de-escalating  Objective   Blood pressure (!) 127/46, pulse (!) 49, temperature 98.06 F (36.7 C), resp. rate 18, height 5\' 8"  (1.727 m), weight 93.8 kg, SpO2 97 %. CVP:  [6 mmHg-29 mmHg] 9 mmHg  Vent Mode: PRVC FiO2 (%):  [50 %-100 %] 80 % Set Rate:  [18 bmp] 18 bmp Vt Set:  [550 mL] 550 mL PEEP:  [5 cmH20-10 cmH20] 10 cmH20 Plateau Pressure:  [16 cmH20-37 cmH20] 22 cmH20   Intake/Output Summary (Last 24 hours) at 02/13/2021 3300 Last data filed at Feb 13, 2021 0600 Gross per 24 hour  Intake 1687.73 ml  Output 2260 ml  Net -572.27 ml   Filed Weights   01/16/2021 1200 02/02/21 0400 02/13/2021 0500  Weight: 97.5 kg 94.9 kg 93.8 kg    Examination: General: Acute on chronic ill-appearing elderly gentleman lying in bed on mechanical ventilation in no acute distress HEENT: ETT, MM pink/moist, PERRL,  Neuro: Sedated on ventilator CV: s1s2 regular rate and rhythm, no murmur, rubs, or gallops,  PULM: No increased work of breathing, tolerating  ventilator well, bilateral rhonchi much improved compared to day prior after initiation of antibiotics GI: soft, bowel sounds active in all 4 quadrants, non-tender, non-distended, tolerating TF Extremities: warm/dry, no edema  Skin: no rashes or lesions  Labs/imaging that I have personally reviewed    CXR at OSH with cardiac enlargement and vascular congestions   Head CT at OSH negative   Resolved Hospital Problem list     Assessment & Plan:  Cardiac arrest - Concern for volume overload leading to respiratory distress as arrest cause  - Patient was awake and follow commands post arrest but later developed come AMS prompting BIPAP therapy and later intubation prior to transfer  Concern for underlying CHF - Patient was seen at [facility] 5/11 for complaints of increased lower extremity edema with associated dyspnea. Patient refused admission at that time and was discharged form the ED P: Echo with preserved EF and grade 1 diastolic dysfunction Continuous telemetry Strict intake and output Diurese as tolerated Trend CVP Monitor volume status Daily weight Ventilator support as above   Acute respiratory insufficiency -In the setting cardiac arrest and high suspension for pulmonary edema   Concern for evolving CAP -5/24 patient was seen with worsening rhonchi and thick tan secretions P: Empiric ceftriaxone started 5/24  Rhonchi much improved 5/25  Culture  Continue ventilator support with lung protective strategies  Wean PEEP and FiO2 for sats greater than 90%. Head of bed elevated 30 degrees. Plateau pressures less than 30 cm H20.  Follow intermittent chest x-ray and ABG.   SAT/SBT as tolerated, mentation preclude extubation  Ensure adequate pulmonary hygiene  VAP bundle in place  PAD protocol  Acute metabolic encephalopathy  -Patient was seen alert and oriented post arrest P: Continue attempts to lighten sedation, patient was seen with significant respiratory distress with  discontinuation of propofol 5/24 Obtain MRI brain Maintain neuro protective measures Nutrition and bowel regiment Aspiration precautions  Hx of HTN  -Per care everywhere homemedications include labetalol, hydralazine, lasix, Benicar,   P: BP remains well controlled Home medications remain on hold Continuous telemetry  Hx of bowel obstruction s/p colectomy with abdominal distension on admisison  At risk for malnutrition  P: KUB without acute abnormalities Continue tube feeds  Prolonged QTc -561 on EKG 5/24 P: Avoid QTC prolonging medications Repeat EKG as needed  Avoid QTc prolonging meds  Repeat EKG as needed   Daily alcohol use  -Family reports long standing history of alcohol abuse with prior extensive use but over the last few years he has decreased the amount. He currently consumes one beer per day  P: CIWA  Supportive care Supplement folate, MV, and Thiamine   Goals of care Patient's family including 2 daughters have reached out to clinical staff to discuss goals of care.  Per family patient has indicated no desire for aggressive measures eluding intubation.  Daughter states patient relayed this information just last week.  Family is currently weighing options and will relay to clinical staff desires for continued aggressive care.  Best practice   Diet:  NPO Pain/Anxiety/Delirium protocol (if indicated): Yes (RASS goal -1) VAP protocol (if indicated): Yes DVT prophylaxis: Subcutaneous Heparin GI prophylaxis: PPI Glucose control:  SSI Yes Central venous access:  Yes, and it is still needed Arterial line:  N/A Foley:  Yes, and it is still needed Mobility:  bed rest  PT consulted: N/A Last date of multidisciplinary goals of care discussion Continue to update family daily  Code Status:  full code  Disposition: ICU    Critical care time:    Performed by: Johnsie Cancel  Total critical care time: 38 minutes  Critical care time was exclusive of separately  billable procedures and treating other patients.  Critical care was necessary to treat or prevent imminent or life-threatening deterioration.  Critical care was time spent personally by me on the following activities: development of treatment plan with patient and/or surrogate as well as nursing, discussions with consultants, evaluation of patient's response to treatment, examination of patient, obtaining history from patient or surrogate, ordering and performing treatments and interventions, ordering and review of laboratory studies, ordering and review of radiographic studies, pulse oximetry and re-evaluation of patient's condition.  Johnsie Cancel, NP-C [facility] Personal contact information can be found on Amion  2021-02-20, 7:17 AM   PCCM:  80 yo M, cardiac arrest, asp pna, chf.  BP (!) 113/46   Pulse (!) 27   Temp (!) 96.44 F (35.8 C)   Resp (!) 8   Ht 5\' 8"  (1.727 m)   Wt 93.8 kg   SpO2 (!) 85%   BMI 31.44 kg/m   Gen: elderly male, on life support  Heart: RRR s1 s2  Lungs: CTAB  Abd: soft nt nd   Labs: reviewed  Scr 1.76  A:  Cardiac arrest  PEA  Acute diastolic heart failure  AHRF on MV  Likely aspiration pna following arrest  Acute metabolic encephalopathy  HTN  AKI DNR  P: Long discussions with family today  Please see documentation by Merlene Laughter, NP Per the patients wife he would not want to be on a ventilator We discussed various options to give it a few days vs liberation now and transition to comfort if he does not make it on his own.  Decision was for the later.  We will plan for extubation and transition to comfort care if the patient does not sustain on his own.   Garner Nash, DO [facility] 2021-02-25 1:08 PM

## 2021-02-10 DEATH — deceased

## 2021-04-19 ENCOUNTER — Ambulatory Visit: Payer: Medicare Other | Admitting: Urology

## 2022-10-19 IMAGING — DX DG CHEST 1V
1 series · 1 of 1 positions shown · non-contrast
Comparison: Earlier this day.

CLINICAL DATA: Respiratory distress.

EXAM:
CHEST  1 VIEW

[chest ap]
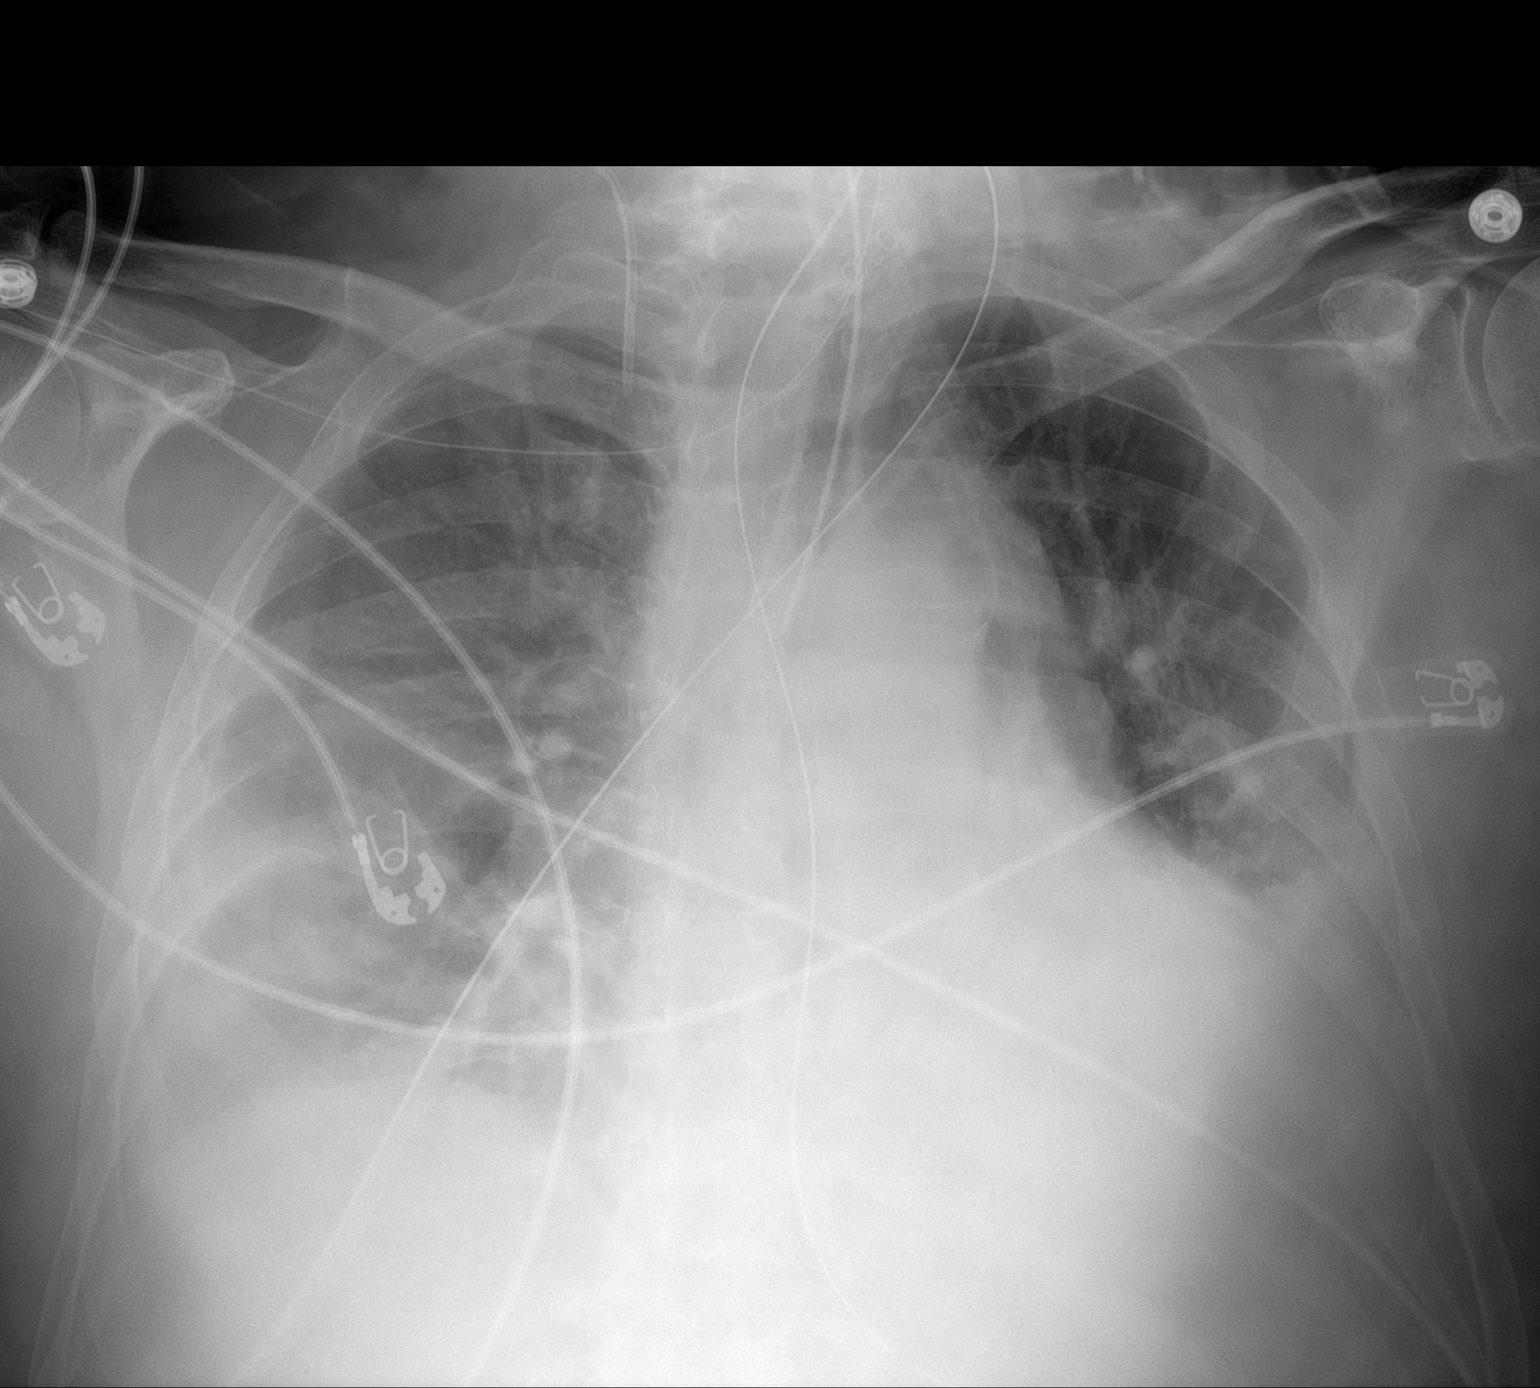

[1 of 1 positions shown; findings below may reference images not displayed]

FINDINGS: Endotracheal tube tip is 2.7 cm from the carina. Enteric tube tip
below the diaphragm not included in the field of view. Right
internal jugular central line tip projects over the jugular vein.
Worsening hazy opacity at the right lung base likely increasing
effusion and atelectasis. Dense left lung base consolidation is
unchanged. Stable heart size. Increasing perivascular haziness. No
pneumothorax.
IMPRESSION: 1. Worsening hazy opacity at the right lung base likely increasing
effusion and atelectasis.
2. Unchanged dense left lung base consolidation and effusion.
3. Increasing perivascular haziness likely pulmonary edema.
4. Support apparatus unchanged.

## 2022-10-19 IMAGING — DX DG CHEST 1V PORT
1 series · 1 of 1 positions shown · non-contrast
Comparison: Chest x-ray 01/31/2021.

CLINICAL DATA: Respiratory distress.

EXAM:
PORTABLE CHEST 1 VIEW

[chest]
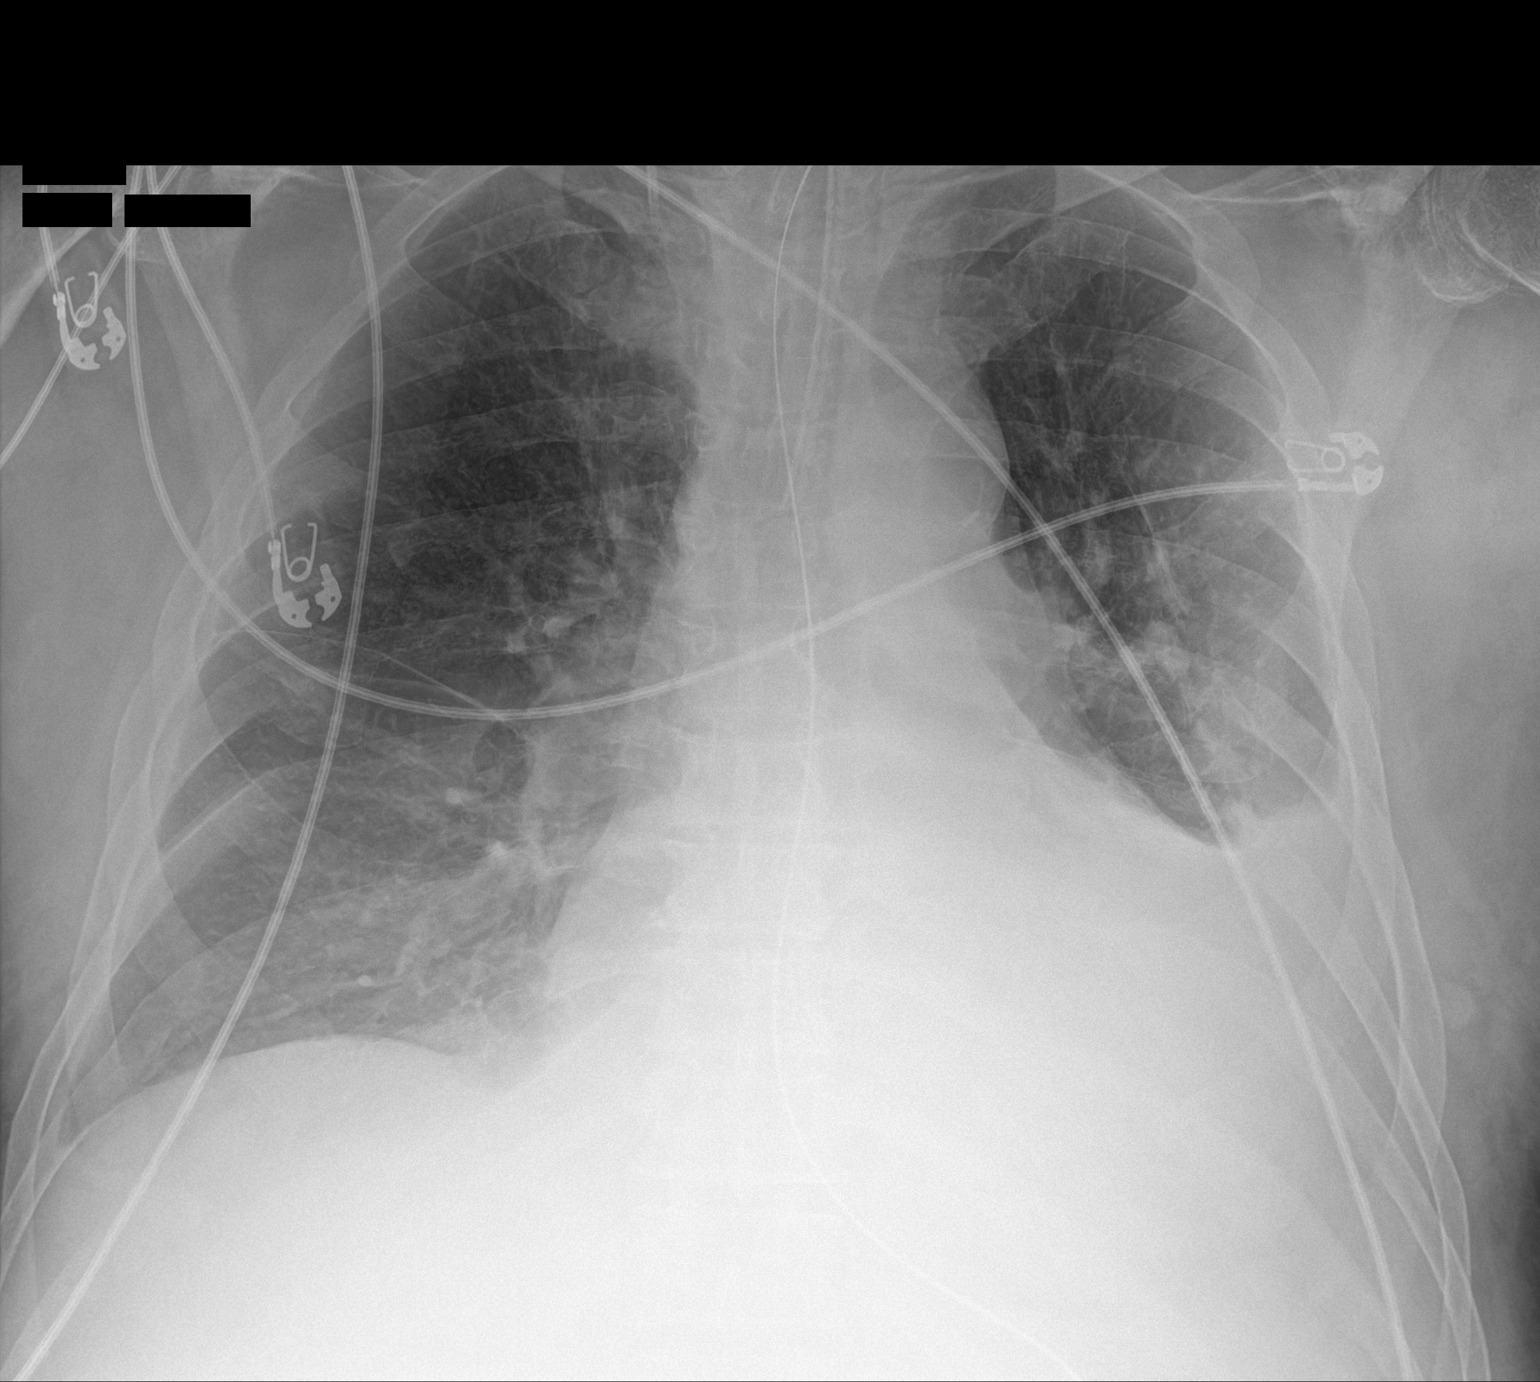

[1 of 1 positions shown; findings below may reference images not displayed]

FINDINGS: Endotracheal tube, NG tube, right IJ line stable position.
Cardiomegaly with normal pulmonary vascularity. Improved aeration of
both lungs with clearing interstitial prominence findings consistent
with clearing CHF. Left lower lobe atelectasis. Persistent
left-sided pleural effusion. No pneumothorax.
IMPRESSION: 1.  Lines and tubes in stable position.

2.  Cardiomegaly.  No pulmonary venous congestion.

3. Interim partial clearing of bilateral interstitial prominence
most consistent with clearing CHF.

4. Left lower lobe atelectasis. Persistent left-sided pleural
effusion.
